# Patient Record
Sex: Female | Born: 1960 | Race: White | Hispanic: No | Marital: Single | Smoking: Current every day smoker
Health system: Southern US, Community
[De-identification: ages and names within clinical notes are randomized; demographics above are authoritative.]

## PROBLEM LIST (undated history)

## (undated) DIAGNOSIS — F32A Depression, unspecified: Secondary | ICD-10-CM

## (undated) DIAGNOSIS — F329 Major depressive disorder, single episode, unspecified: Secondary | ICD-10-CM

## (undated) DIAGNOSIS — F410 Panic disorder [episodic paroxysmal anxiety] without agoraphobia: Secondary | ICD-10-CM

## (undated) DIAGNOSIS — I10 Essential (primary) hypertension: Secondary | ICD-10-CM

## (undated) DIAGNOSIS — F319 Bipolar disorder, unspecified: Secondary | ICD-10-CM

## (undated) HISTORY — PX: COSMETIC SURGERY: SHX468

## (undated) HISTORY — PX: TONSILLECTOMY: SUR1361

## (undated) HISTORY — PX: LAPAROSCOPIC OOPHORECTOMY: SHX6693

---

## 2006-09-07 ENCOUNTER — Emergency Department: Payer: Self-pay | Admitting: Emergency Medicine

## 2008-11-16 ENCOUNTER — Emergency Department: Payer: Self-pay | Admitting: Emergency Medicine

## 2012-08-18 ENCOUNTER — Emergency Department: Payer: Self-pay | Admitting: Emergency Medicine

## 2013-10-07 ENCOUNTER — Ambulatory Visit: Payer: Self-pay | Admitting: Internal Medicine

## 2013-10-07 ENCOUNTER — Other Ambulatory Visit: Payer: Self-pay | Admitting: Internal Medicine

## 2013-10-07 LAB — SEDIMENTATION RATE: Erythrocyte Sed Rate: 1 mm/hr (ref 0–20)

## 2013-10-07 LAB — CBC WITH DIFFERENTIAL/PLATELET
Basophil #: 0.1 10*3/uL (ref 0.0–0.1)
Eosinophil #: 0.2 10*3/uL (ref 0.0–0.7)
Eosinophil %: 2.8 %
HGB: 15.8 g/dL (ref 12.0–16.0)
Lymphocyte #: 1.9 10*3/uL (ref 1.0–3.6)
Lymphocyte %: 21.4 %
MCHC: 33.8 g/dL (ref 32.0–36.0)
MCV: 94 fL (ref 80–100)
Monocyte #: 0.6 x10 3/mm (ref 0.2–0.9)
Neutrophil #: 6 10*3/uL (ref 1.4–6.5)
Neutrophil %: 67.9 %
Platelet: 202 10*3/uL (ref 150–440)
RBC: 5 10*6/uL (ref 3.80–5.20)

## 2013-10-07 LAB — COMPREHENSIVE METABOLIC PANEL
Bilirubin,Total: 0.7 mg/dL (ref 0.2–1.0)
Calcium, Total: 9.4 mg/dL (ref 8.5–10.1)
Chloride: 107 mmol/L (ref 98–107)
Creatinine: 0.74 mg/dL (ref 0.60–1.30)
EGFR (African American): >60
EGFR (Non-African Amer.): >60
Glucose: 94 mg/dL (ref 65–99)
Potassium: 4.7 mmol/L (ref 3.5–5.1)
SGPT (ALT): 28 U/L (ref 12–78)
Sodium: 135 mmol/L — ABNORMAL LOW (ref 136–145)
Total Protein: 7.8 g/dL (ref 6.4–8.2)

## 2013-10-07 LAB — FOLATE: Folic Acid: 13.9 ng/mL (ref 3.1–100.0)

## 2013-10-07 LAB — LIPID PANEL
Cholesterol: 170 mg/dL (ref 0–200)
HDL Cholesterol: 52 mg/dL (ref 40–60)
Ldl Cholesterol, Calc: 95 mg/dL (ref 0–100)
Triglycerides: 113 mg/dL (ref 0–200)

## 2013-12-02 ENCOUNTER — Emergency Department: Payer: Self-pay | Admitting: Emergency Medicine

## 2013-12-02 LAB — URINALYSIS, COMPLETE
Bacteria: NONE SEEN
Bilirubin,UR: NEGATIVE
Glucose,UR: NEGATIVE mg/dL (ref 0–75)
LEUKOCYTE ESTERASE: NEGATIVE
Nitrite: NEGATIVE
PH: 5 (ref 4.5–8.0)
Protein: NEGATIVE
RBC,UR: 1 /HPF (ref 0–5)
Specific Gravity: 1.006 (ref 1.003–1.030)
Squamous Epithelial: 1

## 2014-09-28 ENCOUNTER — Ambulatory Visit: Payer: Self-pay | Admitting: Physician Assistant

## 2014-09-28 LAB — CBC WITH DIFFERENTIAL/PLATELET
BASOS ABS: 0.1 10*3/uL (ref 0.0–0.1)
Basophil %: 1 %
Eosinophil #: 0.2 10*3/uL (ref 0.0–0.7)
Eosinophil %: 2.1 %
HCT: 44.3 % (ref 35.0–47.0)
HGB: 14.9 g/dL (ref 12.0–16.0)
LYMPHS PCT: 20.9 %
Lymphocyte #: 1.6 10*3/uL (ref 1.0–3.6)
MCH: 33.1 pg (ref 26.0–34.0)
MCHC: 33.5 g/dL (ref 32.0–36.0)
MCV: 99 fL (ref 80–100)
MONO ABS: 0.5 x10 3/mm (ref 0.2–0.9)
Monocyte %: 6.7 %
NEUTROS ABS: 5.2 10*3/uL (ref 1.4–6.5)
Neutrophil %: 69.3 %
Platelet: 253 10*3/uL (ref 150–440)
RBC: 4.49 10*6/uL (ref 3.80–5.20)
RDW: 12.8 % (ref 11.5–14.5)
WBC: 7.6 10*3/uL (ref 3.6–11.0)

## 2014-09-28 LAB — LIPID PANEL
CHOLESTEROL: 170 mg/dL (ref 0–200)
HDL: 54 mg/dL (ref 40–60)
Ldl Cholesterol, Calc: 85 mg/dL (ref 0–100)
TRIGLYCERIDES: 156 mg/dL (ref 0–200)
VLDL CHOLESTEROL, CALC: 31 mg/dL (ref 5–40)

## 2014-09-28 LAB — COMPREHENSIVE METABOLIC PANEL
ANION GAP: 8 (ref 7–16)
Albumin: 3.8 g/dL (ref 3.4–5.0)
Alkaline Phosphatase: 68 U/L
BUN: 9 mg/dL (ref 7–18)
Bilirubin,Total: 0.5 mg/dL (ref 0.2–1.0)
CO2: 25 mmol/L (ref 21–32)
CREATININE: 0.82 mg/dL (ref 0.60–1.30)
Calcium, Total: 8.8 mg/dL (ref 8.5–10.1)
Chloride: 107 mmol/L (ref 98–107)
Glucose: 105 mg/dL — ABNORMAL HIGH (ref 65–99)
OSMOLALITY: 278 (ref 275–301)
Potassium: 3.9 mmol/L (ref 3.5–5.1)
SGOT(AST): 32 U/L (ref 15–37)
SGPT (ALT): 41 U/L
SODIUM: 140 mmol/L (ref 136–145)
Total Protein: 7.8 g/dL (ref 6.4–8.2)

## 2014-09-28 LAB — TSH: Thyroid Stimulating Horm: 2.5 u[IU]/mL

## 2014-11-14 ENCOUNTER — Ambulatory Visit: Payer: Self-pay | Admitting: Internal Medicine

## 2014-11-14 LAB — HCG, QUANTITATIVE, PREGNANCY

## 2014-11-18 IMAGING — CR DG CHEST 2V
1 series · 2 of 2 positions shown · non-contrast
Comparison: None.

CLINICAL DATA: Chest pain, cough

EXAM:
CHEST  2 VIEW

[Series 1: w chest pa · 0.14mm/px · 2 of 2 slices shown]
[im 1/2]
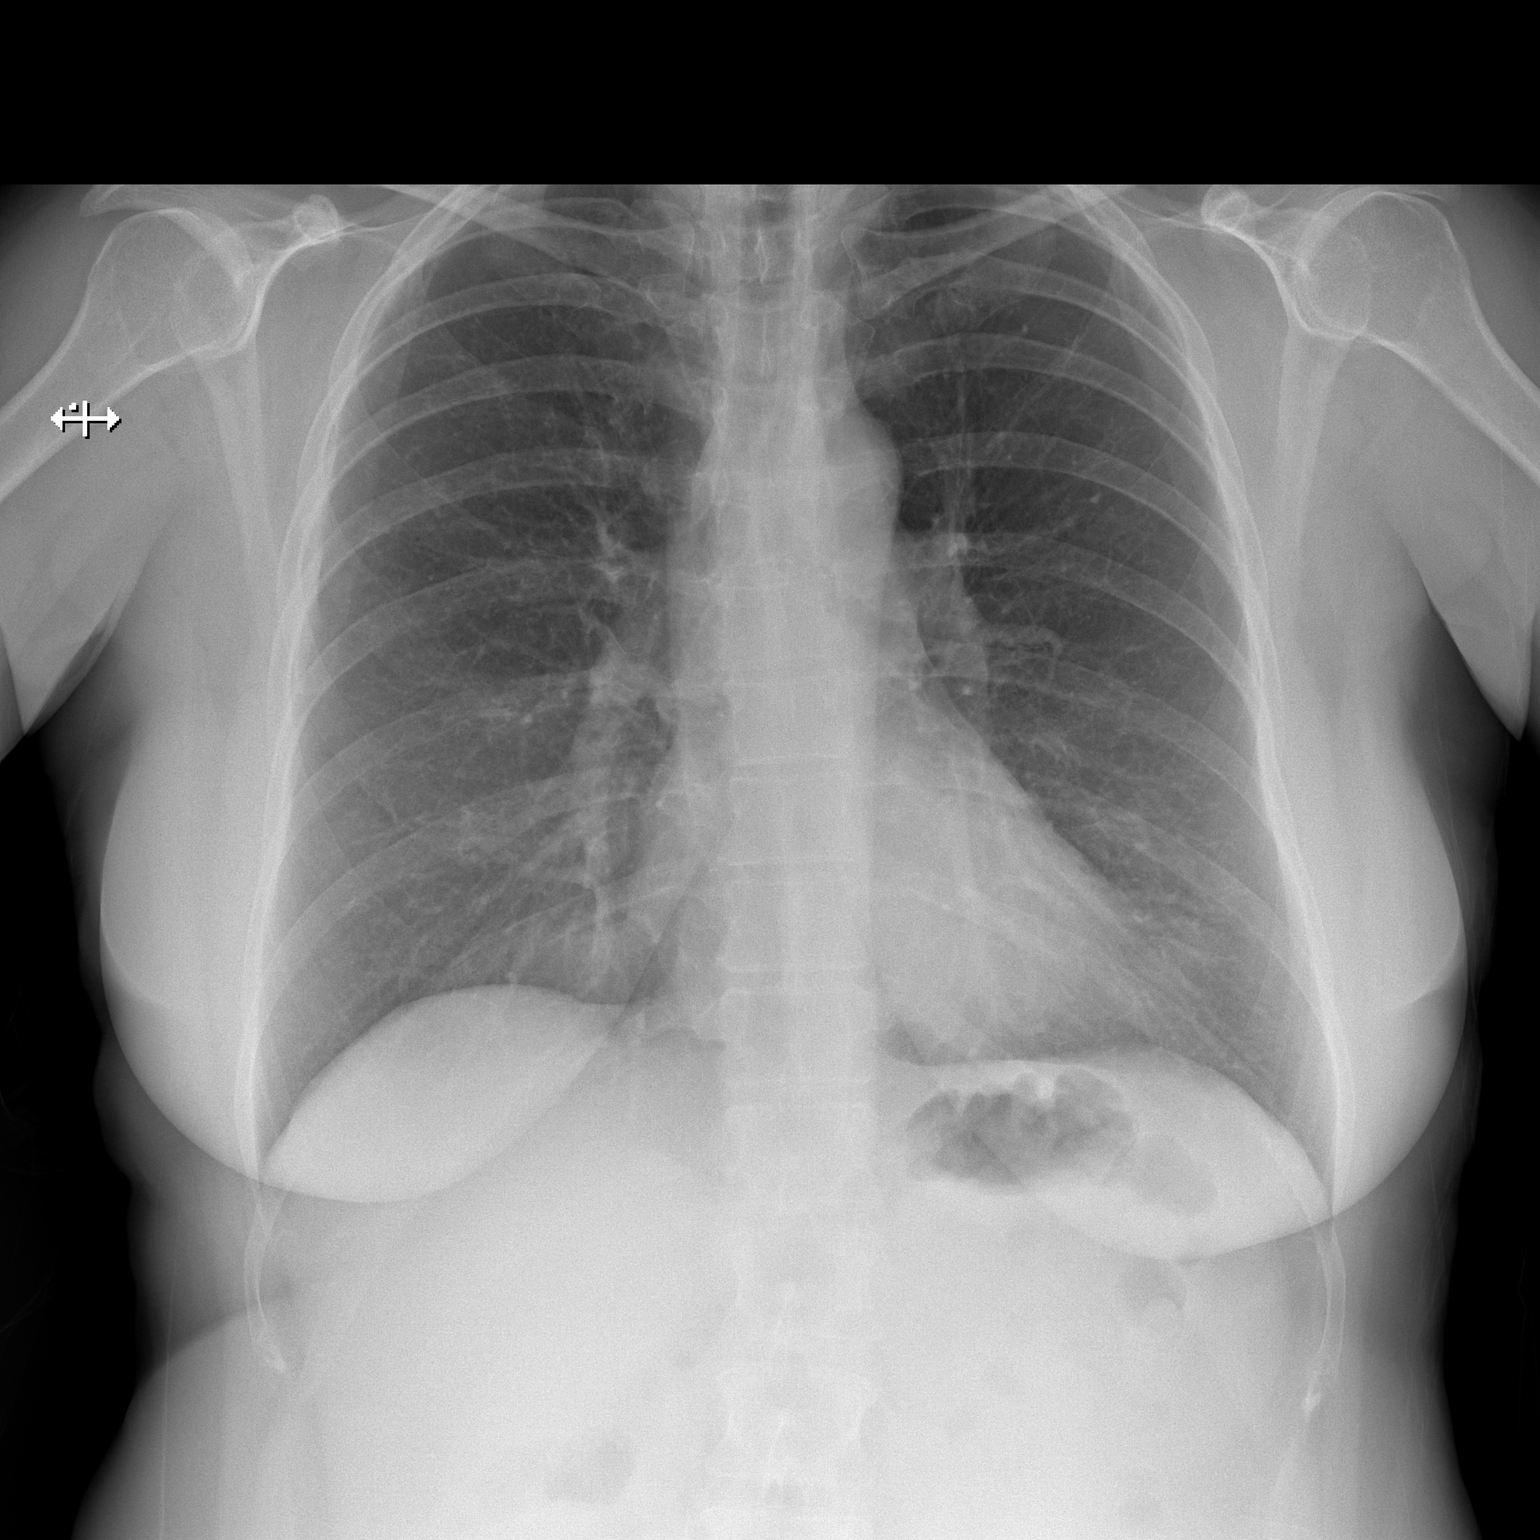
[im 2/2]
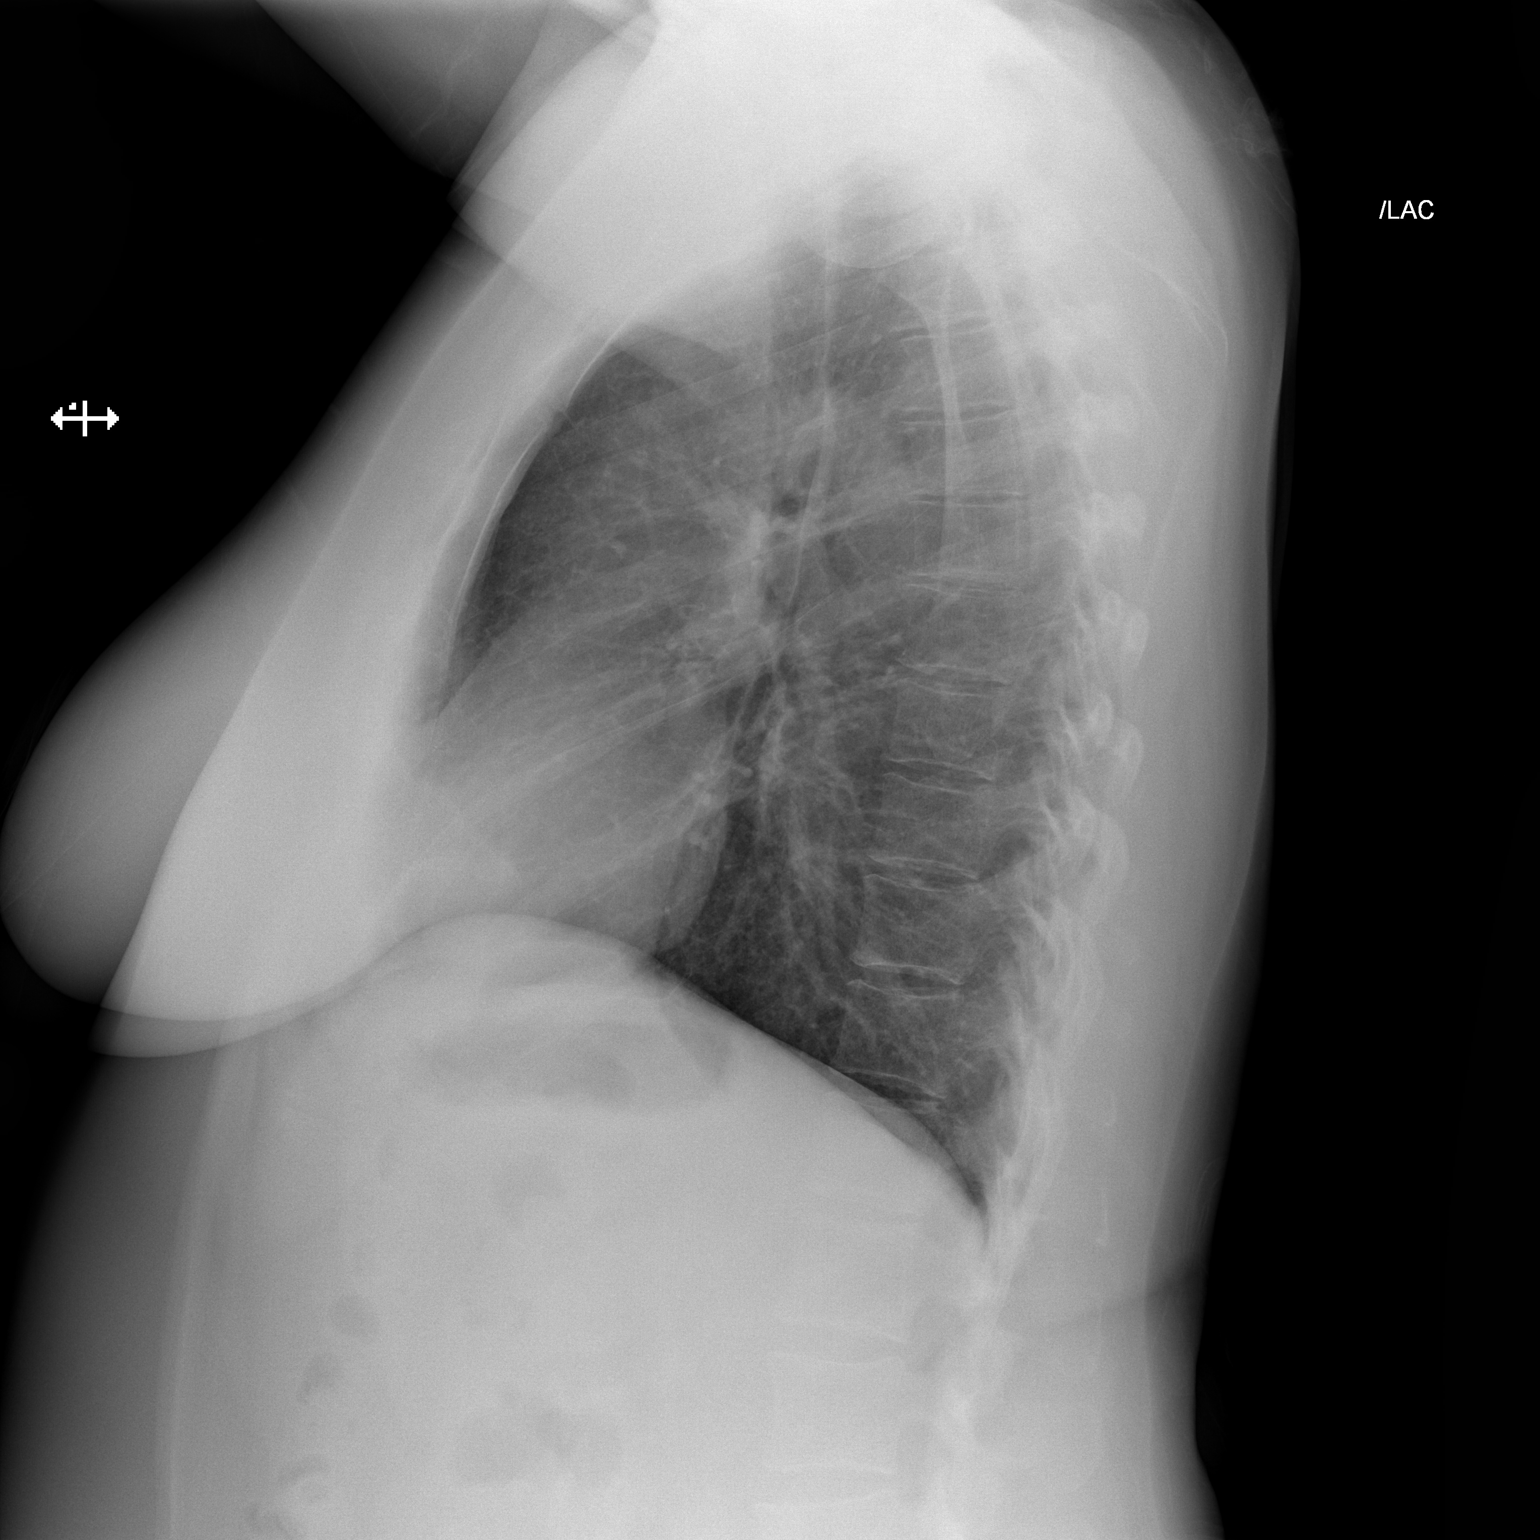

[2 of 2 positions shown; findings below may reference images not displayed]

FINDINGS: The heart size and mediastinal contours are within normal limits.
Both lungs are clear. The visualized skeletal structures are
unremarkable.
IMPRESSION: No active cardiopulmonary disease.

## 2015-04-26 ENCOUNTER — Other Ambulatory Visit: Payer: Self-pay | Admitting: Nurse Practitioner

## 2015-04-26 ENCOUNTER — Ambulatory Visit
Admission: RE | Admit: 2015-04-26 | Discharge: 2015-04-26 | Disposition: A | Discharge status: Home / Self Care | Payer: Medicaid Other | Source: Ambulatory Visit | Attending: Nurse Practitioner | Admitting: Nurse Practitioner

## 2015-04-26 DIAGNOSIS — R221 Localized swelling, mass and lump, neck: Secondary | ICD-10-CM | POA: Diagnosis not present

## 2015-04-26 DIAGNOSIS — M542 Cervicalgia: Secondary | ICD-10-CM

## 2015-09-14 ENCOUNTER — Encounter: Payer: Self-pay | Admitting: Emergency Medicine

## 2015-09-14 ENCOUNTER — Emergency Department
Admission: EM | Admit: 2015-09-14 | Discharge: 2015-09-14 | Disposition: A | Discharge status: Home / Self Care | Payer: Medicaid Other | Attending: Emergency Medicine | Admitting: Emergency Medicine

## 2015-09-14 DIAGNOSIS — Z720 Tobacco use: Secondary | ICD-10-CM | POA: Insufficient documentation

## 2015-09-14 DIAGNOSIS — I10 Essential (primary) hypertension: Secondary | ICD-10-CM | POA: Insufficient documentation

## 2015-09-14 DIAGNOSIS — Y9389 Activity, other specified: Secondary | ICD-10-CM | POA: Insufficient documentation

## 2015-09-14 DIAGNOSIS — S29012A Strain of muscle and tendon of back wall of thorax, initial encounter: Secondary | ICD-10-CM | POA: Diagnosis not present

## 2015-09-14 DIAGNOSIS — Y9289 Other specified places as the place of occurrence of the external cause: Secondary | ICD-10-CM | POA: Insufficient documentation

## 2015-09-14 DIAGNOSIS — X58XXXA Exposure to other specified factors, initial encounter: Secondary | ICD-10-CM | POA: Insufficient documentation

## 2015-09-14 DIAGNOSIS — Y998 Other external cause status: Secondary | ICD-10-CM | POA: Diagnosis not present

## 2015-09-14 DIAGNOSIS — S299XXA Unspecified injury of thorax, initial encounter: Secondary | ICD-10-CM | POA: Diagnosis present

## 2015-09-14 HISTORY — DX: Bipolar disorder, unspecified: F31.9

## 2015-09-14 HISTORY — DX: Essential (primary) hypertension: I10

## 2015-09-14 HISTORY — DX: Panic disorder (episodic paroxysmal anxiety): F41.0

## 2015-09-14 HISTORY — DX: Depression, unspecified: F32.A

## 2015-09-14 HISTORY — DX: Major depressive disorder, single episode, unspecified: F32.9

## 2015-09-14 MED ORDER — KETOROLAC TROMETHAMINE 10 MG PO TABS: 10.0000 mg | ORAL_TABLET | Freq: Four times a day (QID) | ORAL | Status: DC | PRN

## 2015-09-14 MED ORDER — DIAZEPAM 2 MG PO TABS
2.0000 mg | ORAL_TABLET | Freq: Once | ORAL | Status: AC
  Administered 2015-09-14: 2 mg via ORAL
  Filled 2015-09-14: qty 1

## 2015-09-14 MED ORDER — KETOROLAC TROMETHAMINE 60 MG/2ML IM SOLN
60.0000 mg | Freq: Once | INTRAMUSCULAR | Status: AC
  Administered 2015-09-14: 60 mg via INTRAMUSCULAR
  Filled 2015-09-14: qty 2

## 2015-09-14 MED ORDER — HYDROCODONE-ACETAMINOPHEN 5-325 MG PO TABS
1.0000 | ORAL_TABLET | Freq: Once | ORAL | Status: AC
  Administered 2015-09-14: 1 via ORAL
  Filled 2015-09-14: qty 1

## 2015-09-14 MED ORDER — HYDROCODONE-ACETAMINOPHEN 5-325 MG PO TABS: 1.0000 | ORAL_TABLET | ORAL | Status: DC | PRN

## 2015-09-14 MED ORDER — HYDROCODONE-ACETAMINOPHEN 5-325 MG PO TABS
ORAL_TABLET | ORAL | Status: AC
  Filled 2015-09-14: qty 1

## 2015-09-14 MED ORDER — DIAZEPAM 2 MG PO TABS: 2.0000 mg | ORAL_TABLET | Freq: Three times a day (TID) | ORAL | Status: DC | PRN

## 2015-09-14 NOTE — Discharge Instructions (Signed)
Muscle Strain °A muscle strain (pulled muscle) happens when a muscle is stretched beyond normal length. It happens when a sudden, violent force stretches your muscle too far. Usually, a few of the fibers in your muscle are torn. Muscle strain is common in athletes. Recovery usually takes 1-2 weeks. Complete healing takes 5-6 weeks.  °HOME CARE  °· Follow the PRICE method of treatment to help your injury get better. Do this the first 2-3 days after the injury: °· Protect. Protect the muscle to keep it from getting injured again. °· Rest. Limit your activity and rest the injured body part. °· Ice. Put ice in a plastic bag. Place a towel between your skin and the bag. Then, apply the ice and leave it on from 15-20 minutes each hour. After the third day, switch to moist heat packs. °· Compression. Use a splint or elastic bandage on the injured area for comfort. Do not put it on too tightly. °· Elevate. Keep the injured body part above the level of your heart. °· Only take medicine as told by your doctor. °· Warm up before doing exercise to prevent future muscle strains. °GET HELP IF:  °· You have more pain or puffiness (swelling) in the injured area. °· You feel numbness, tingling, or notice a loss of strength in the injured area. °MAKE SURE YOU:  °· Understand these instructions. °· Will watch your condition. °· Will get help right away if you are not doing well or get worse. °  °This information is not intended to replace advice given to you by your health care provider. Make sure you discuss any questions you have with your health care provider. °  °Document Released: 08/13/2008 Document Revised: 08/25/2013 Document Reviewed: 06/03/2013 °Elsevier Interactive Patient Education ©2016 [employer] ° °Cryotherapy °Cryotherapy is when you put ice on your injury. Ice helps lessen pain and puffiness (swelling) after an injury. Ice works the best when you start using it in the first 24 to 48 hours after an injury. °HOME  CARE °· Put a dry or damp towel between the ice pack and your skin. °· You may press gently on the ice pack. °· Leave the ice on for no more than 10 to 20 minutes at a time. °· Check your skin after 5 minutes to make sure your skin is okay. °· Rest at least 20 minutes between ice pack uses. °· Stop using ice when your skin loses feeling (numbness). °· Do not use ice on someone who cannot tell you when it hurts. This includes small children and people with memory problems (dementia). °GET HELP RIGHT AWAY IF: °· You have white spots on your skin. °· Your skin turns blue or pale. °· Your skin feels waxy or hard. °· Your puffiness gets worse. °MAKE SURE YOU:  °· Understand these instructions. °· Will watch your condition. °· Will get help right away if you are not doing well or get worse. °  °This information is not intended to replace advice given to you by your health care provider. Make sure you discuss any questions you have with your health care provider. °  °Document Released: 04/22/2008 Document Revised: 01/27/2012 Document Reviewed: 06/27/2011 °Elsevier Interactive Patient Education ©2016 [employer] ° °

## 2015-09-14 NOTE — ED Notes (Signed)
Pt to ed with c/o back pain that started after she lifted her dog off the bed x 3 days ago.

## 2015-09-14 NOTE — ED Notes (Signed)
Having lower back pain for the past 3 days.states she lifted her dog from bed and woke up with pain to lower back the next am. states pain is non radiating.  Pain increased with movement and inspriation

## 2015-09-14 NOTE — ED Provider Notes (Signed)
[facility] Emergency Department Provider Note  ____________________________________________  Time seen: Approximately 12:32 PM  I have reviewed the triage vital signs and the nursing notes.   HISTORY  Chief Complaint Back Pain   HPI Melody Moss is a 54 y.o. female 0 complaint of left sided back pain for 3 days. Patient states that she lifted her dog from the bed but did not recall an injury at that time. Later she woke with pain the next day which has not improved. Patient states the pain as sharp, nonradiating but increases with movement or inspiration. She is taken some over-the-counter medication at home without any relief. She denies any previous problems with her back. She denies any bowel or bladder incontinence. Patient complains that her muscles "spasm" which is what she cannot stand. Currently she rates her pain as an 8 out of 10.   Past Medical History  Diagnosis Date  . Hypertension   . Panic attack   . Depression   . Bipolar 1 disorder (HCC)     There are no active problems to display for this patient.   History reviewed. No pertinent past surgical history.  Current Outpatient Rx  Name  Route  Sig  Dispense  Refill  . diazepam (VALIUM) 2 MG tablet   Oral   Take 1 tablet (2 mg total) by mouth every 8 (eight) hours as needed for muscle spasms.   9 tablet   0   . HYDROcodone-acetaminophen (NORCO/VICODIN) 5-325 MG tablet   Oral   Take 1 tablet by mouth every 4 (four) hours as needed for moderate pain.   20 tablet   0   . ketorolac (TORADOL) 10 MG tablet   Oral   Take 1 tablet (10 mg total) by mouth every 6 (six) hours as needed for moderate pain.   12 tablet   0     Allergies Review of patient's allergies indicates no known allergies.  History reviewed. No pertinent family history.  Social History Social History  Substance Use Topics  . Smoking status: Current Every Day Smoker  . Smokeless tobacco: None  . Alcohol Use:  Yes    Review of Systems Constitutional: No fever/chills Cardiovascular: Denies chest pain. Respiratory: Denies shortness of breath. Gastrointestinal: No abdominal pain.  No nausea, no vomiting.   Genitourinary: Negative for dysuria. Musculoskeletal: Positive for back pain. Skin: Negative for rash. Neurological: Negative for headaches, focal weakness or numbness.  10-point ROS otherwise negative.  ____________________________________________   PHYSICAL EXAM:  VITAL SIGNS: ED Triage Vitals  Enc Vitals Group     BP 09/14/15 1121 137/88 mmHg     Pulse Rate 09/14/15 1121 63     Resp --      Temp 09/14/15 1121 98.4 F (36.9 C)     Temp Source 09/14/15 1121 Oral     SpO2 09/14/15 1121 97 %     Weight 09/14/15 1121 170 lb (77.111 kg)     Height 09/14/15 1121 5\' 3"  (1.6 m)     Head Cir --      Peak Flow --      Pain Score 09/14/15 1123 8     Pain Loc --      Pain Edu? --      Excl. in GC? --     Constitutional: Alert and oriented. Well appearing and in no acute distress. Eyes: Conjunctivae are normal. PERRL. EOMI. Head: Atraumatic. Nose: No congestion/rhinnorhea. Neck: No stridor.   Cardiovascular: Normal rate, regular rhythm.  Grossly normal heart sounds.  Good peripheral circulation. Respiratory: Normal respiratory effort.  No retractions. Lungs CTAB. Gastrointestinal: Soft and nontender. No distention. No abdominal bruits. No CVA tenderness. Musculoskeletal: Examination of the back feels show any gross abnormality. There is moderate tenderness on palpation of the left lateral thoracic muscle but nontender to spinous processes. Muscle is tight and spasms were seen with range of motion. No lower extremity tenderness nor edema.  No joint effusions. Neurologic:  Normal speech and language. No gross focal neurologic deficits are appreciated. No gait instability. Reflexes are 2+ bilaterally. Skin:  Skin is warm, dry and intact. No rash noted. Psychiatric: Mood and affect are  normal. Speech and behavior are normal.  ____________________________________________   LABS (all labs ordered are listed, but only abnormal results are displayed)  Labs Reviewed - No data to display  PROCEDURES  Procedure(s) performed: None  Critical Care performed: No  ____________________________________________   INITIAL IMPRESSION / ASSESSMENT AND PLAN / ED COURSE  Pertinent labs & imaging results that were available during my care of the patient were reviewed by me and considered in my medical decision making (see chart for details).  ----------------------------------------- 1:29 PM on 09/14/2015 ----------------------------------------- Patient improved with muscle spasms. Patient was given a prescription for Toradol 10 mg one every 6 hours #12 tablets, Norco as fever pain #20 and Valium 2 mg one every 8 hours for 3 days. Patient is encouraged to use ice or heat to her muscles as needed. She also has an appointment to follow-up with her primary care doctor. ____________________________________________   FINAL CLINICAL IMPRESSION(S) / ED DIAGNOSES  Final diagnoses:  Muscle strain of left upper back, initial encounter      Tommi Rumps, PA-C 09/14/15 1342  Arnaldo Natal, MD 09/14/15 [phone]

## 2016-06-06 IMAGING — CR DG CERVICAL SPINE COMPLETE 4+V
1 series · 5 of 5 positions shown · non-contrast
Comparison: None.

CLINICAL DATA: Pain.  No known injury.

EXAM:
CERVICAL SPINE  4+ VIEWS

[Series 1: dg cervical spine complete · 0.14mm/px · 5 of 5 slices shown]
[im 1/5]
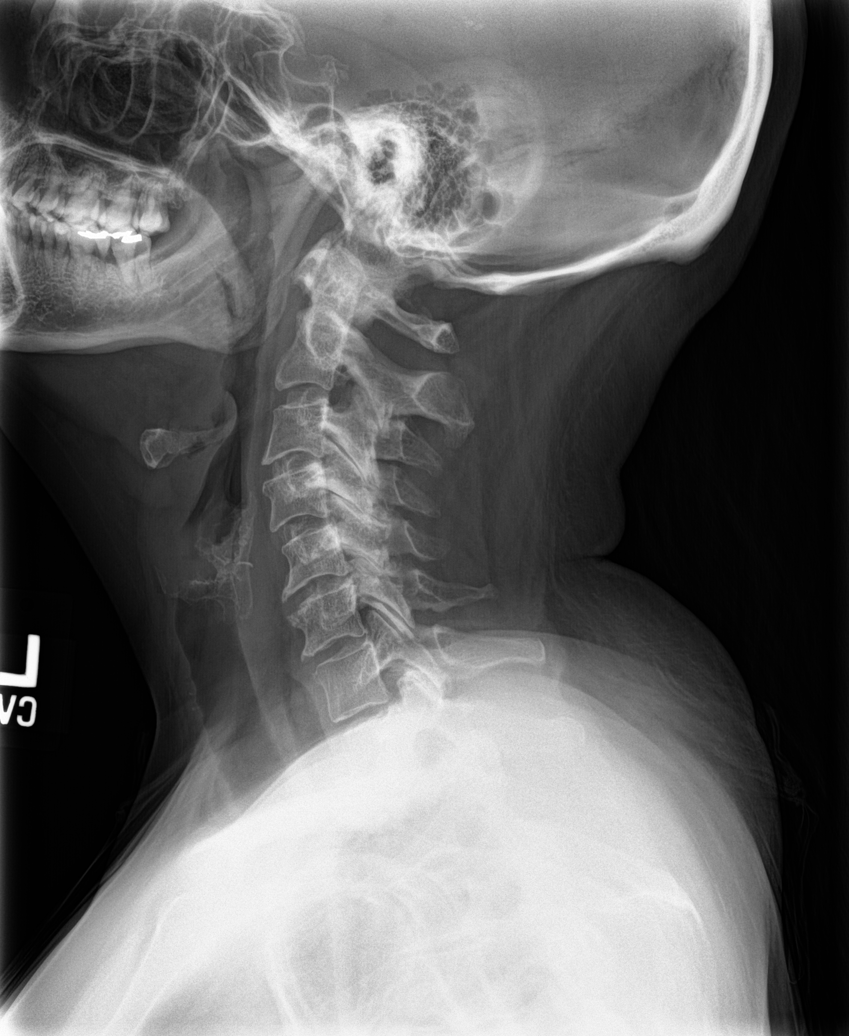
[im 2/5]
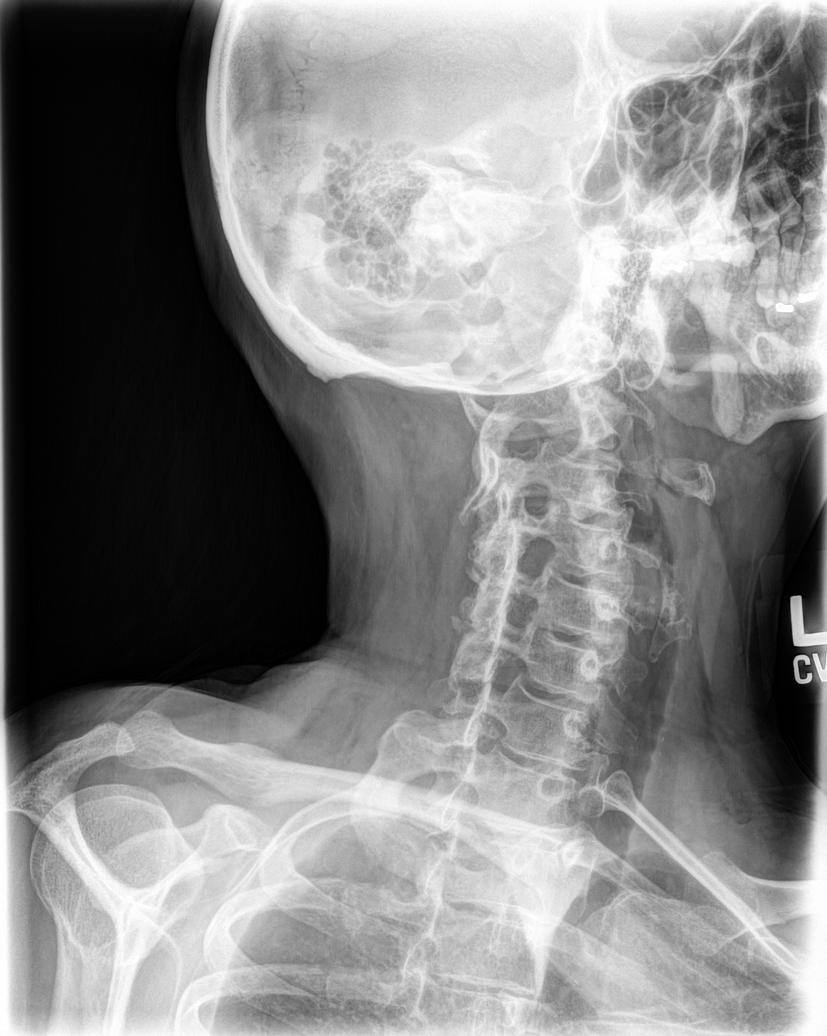
[im 3/5]
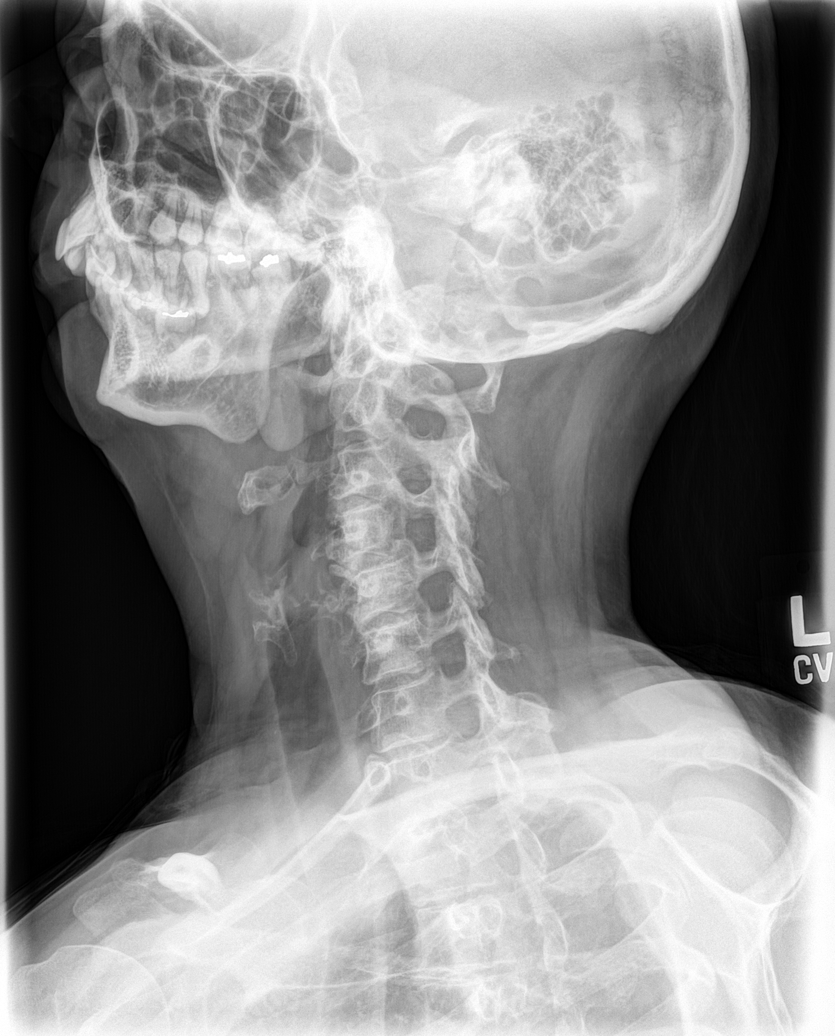
[im 4/5]
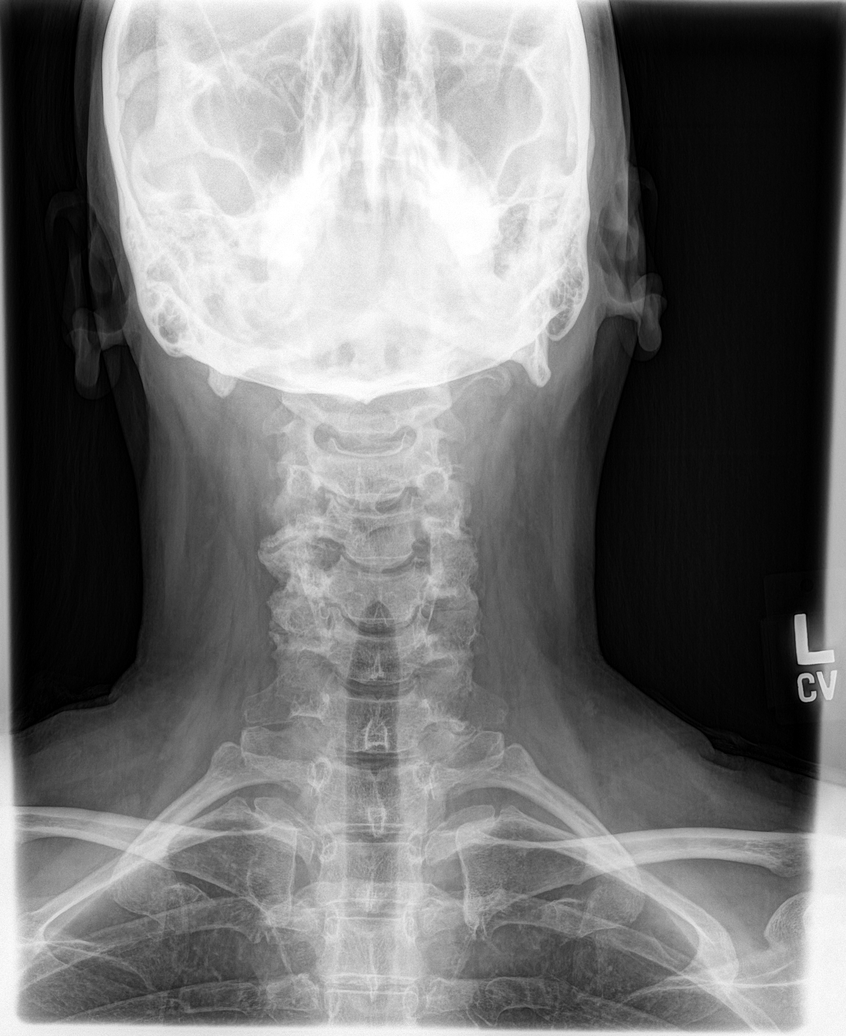
[im 5/5]
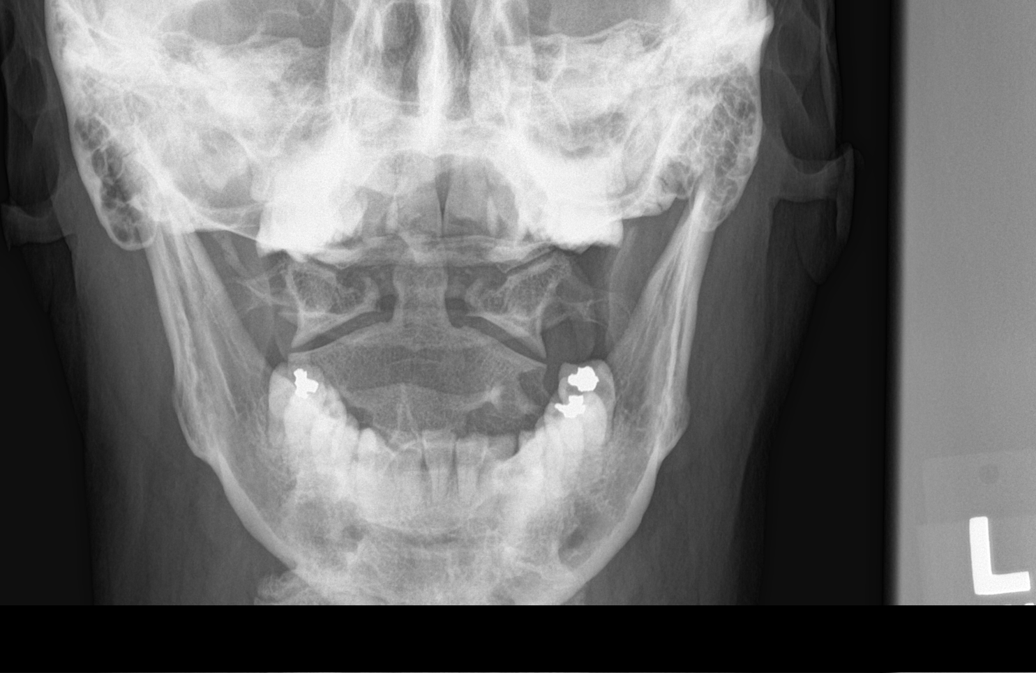

[5 of 5 positions shown; findings below may reference images not displayed]

FINDINGS: No acute bony abnormality . Diffuse multilevel degenerative change
noted. Prominent osteophytes at C5-C6. Pulmonary apices are clear.
IMPRESSION: 1. No acute bony abnormality.
2. Diffuse degenerative change.

## 2016-11-15 ENCOUNTER — Other Ambulatory Visit: Payer: Self-pay | Admitting: Nurse Practitioner

## 2016-11-15 DIAGNOSIS — Z1231 Encounter for screening mammogram for malignant neoplasm of breast: Secondary | ICD-10-CM

## 2016-12-18 ENCOUNTER — Ambulatory Visit
Admission: RE | Admit: 2016-12-18 | Discharge: 2016-12-18 | Disposition: A | Discharge status: Home / Self Care | Payer: Medicaid Other | Source: Ambulatory Visit | Attending: Nurse Practitioner | Admitting: Nurse Practitioner

## 2016-12-18 ENCOUNTER — Other Ambulatory Visit
Admission: RE | Admit: 2016-12-18 | Discharge: 2016-12-18 | Disposition: A | Discharge status: Home / Self Care | Payer: Medicaid Other | Source: Ambulatory Visit | Attending: Nurse Practitioner | Admitting: Nurse Practitioner

## 2016-12-18 DIAGNOSIS — Z1231 Encounter for screening mammogram for malignant neoplasm of breast: Secondary | ICD-10-CM | POA: Insufficient documentation

## 2016-12-18 LAB — LIPID PANEL
Cholesterol: 167 mg/dL (ref 0–200)
HDL: 44 mg/dL (ref >40)
LDL Cholesterol: 71 mg/dL (ref 0–99)
Total CHOL/HDL Ratio: 3.8 RATIO
Triglycerides: 260 mg/dL — ABNORMAL HIGH (ref <150)
VLDL: 52 mg/dL — ABNORMAL HIGH (ref 0–40)

## 2016-12-18 LAB — CBC
HCT: 41.9 % (ref 35.0–47.0)
Hemoglobin: 14.2 g/dL (ref 12.0–16.0)
MCH: 33.7 pg (ref 26.0–34.0)
MCHC: 33.8 g/dL (ref 32.0–36.0)
MCV: 99.6 fL (ref 80.0–100.0)
Platelets: 269 10*3/uL (ref 150–440)
RBC: 4.2 MIL/uL (ref 3.80–5.20)
RDW: 14 % (ref 11.5–14.5)
WBC: 5.3 10*3/uL (ref 3.6–11.0)

## 2016-12-18 LAB — COMPREHENSIVE METABOLIC PANEL
ALT: 23 U/L (ref 14–54)
AST: 23 U/L (ref 15–41)
Albumin: 4.3 g/dL (ref 3.5–5.0)
Alkaline Phosphatase: 56 U/L (ref 38–126)
Anion gap: 7 (ref 5–15)
BUN: 12 mg/dL (ref 6–20)
CO2: 27 mmol/L (ref 22–32)
CREATININE: 0.97 mg/dL (ref 0.44–1.00)
Calcium: 9.5 mg/dL (ref 8.9–10.3)
Chloride: 105 mmol/L (ref 101–111)
GFR calc Af Amer: >60 mL/min (ref >60)
GFR calc non Af Amer: >60 mL/min (ref >60)
GLUCOSE: 122 mg/dL — AB (ref 65–99)
Potassium: 4.7 mmol/L (ref 3.5–5.1)
SODIUM: 139 mmol/L (ref 135–145)
Total Bilirubin: 0.8 mg/dL (ref 0.3–1.2)
Total Protein: 7.3 g/dL (ref 6.5–8.1)

## 2016-12-18 LAB — TSH: TSH: 5.336 u[IU]/mL — ABNORMAL HIGH (ref 0.350–4.500)

## 2016-12-18 LAB — T4, FREE: Free T4: 0.79 ng/dL (ref 0.61–1.12)

## 2016-12-19 LAB — VITAMIN D 25 HYDROXY (VIT D DEFICIENCY, FRACTURES): Vit D, 25-Hydroxy: 18.7 ng/mL — ABNORMAL LOW (ref 30.0–100.0)

## 2016-12-20 ENCOUNTER — Other Ambulatory Visit: Payer: Self-pay | Admitting: *Deleted

## 2016-12-20 ENCOUNTER — Inpatient Hospital Stay
Admission: RE | Admit: 2016-12-20 | Discharge: 2016-12-20 | Disposition: A | Discharge status: Home / Self Care | Payer: Self-pay | Source: Ambulatory Visit | Attending: *Deleted | Admitting: *Deleted

## 2016-12-20 DIAGNOSIS — Z9289 Personal history of other medical treatment: Secondary | ICD-10-CM

## 2017-11-27 ENCOUNTER — Ambulatory Visit: Payer: Medicaid Other | Admitting: Nurse Practitioner

## 2017-11-27 ENCOUNTER — Encounter: Payer: Self-pay | Admitting: Nurse Practitioner

## 2017-11-27 VITALS — BP 130/80 | HR 70 | Resp 16 | Ht 63.0 in | Wt 165.2 lb

## 2017-11-27 DIAGNOSIS — R0602 Shortness of breath: Secondary | ICD-10-CM | POA: Diagnosis not present

## 2017-11-27 DIAGNOSIS — I10 Essential (primary) hypertension: Secondary | ICD-10-CM | POA: Diagnosis not present

## 2017-11-27 DIAGNOSIS — K219 Gastro-esophageal reflux disease without esophagitis: Secondary | ICD-10-CM

## 2017-11-27 DIAGNOSIS — Z0001 Encounter for general adult medical examination with abnormal findings: Secondary | ICD-10-CM

## 2017-11-27 DIAGNOSIS — G43009 Migraine without aura, not intractable, without status migrainosus: Secondary | ICD-10-CM | POA: Diagnosis not present

## 2017-11-27 DIAGNOSIS — F411 Generalized anxiety disorder: Secondary | ICD-10-CM

## 2017-11-27 DIAGNOSIS — E039 Hypothyroidism, unspecified: Secondary | ICD-10-CM | POA: Diagnosis not present

## 2017-11-27 DIAGNOSIS — M25541 Pain in joints of right hand: Secondary | ICD-10-CM | POA: Diagnosis not present

## 2017-11-27 DIAGNOSIS — J45909 Unspecified asthma, uncomplicated: Secondary | ICD-10-CM | POA: Insufficient documentation

## 2017-11-27 DIAGNOSIS — Z1231 Encounter for screening mammogram for malignant neoplasm of breast: Secondary | ICD-10-CM

## 2017-11-27 DIAGNOSIS — Z1239 Encounter for other screening for malignant neoplasm of breast: Secondary | ICD-10-CM

## 2017-11-27 MED ORDER — SPIRONOLACTONE 50 MG PO TABS: 50.0000 mg | ORAL_TABLET | Freq: Every day | ORAL | 3 refills | Status: DC

## 2017-11-27 MED ORDER — LEVOTHYROXINE SODIUM 25 MCG PO TABS: 25.0000 ug | ORAL_TABLET | Freq: Every day | ORAL | 3 refills | Status: DC

## 2017-11-27 MED ORDER — PROPRANOLOL HCL ER 160 MG PO CP24: 160.0000 mg | ORAL_CAPSULE | Freq: Every day | ORAL | 3 refills | Status: DC

## 2017-11-27 MED ORDER — PROAIR HFA 108 (90 BASE) MCG/ACT IN AERS: 2.0000 | INHALATION_SPRAY | Freq: Four times a day (QID) | RESPIRATORY_TRACT | 3 refills | Status: DC | PRN

## 2017-11-27 MED ORDER — OMEPRAZOLE 40 MG PO CPDR: 40.0000 mg | DELAYED_RELEASE_CAPSULE | Freq: Every day | ORAL | 3 refills | Status: DC

## 2017-11-27 NOTE — Progress Notes (Signed)
[facility] [address] [place], [place] [address]  Internal MEDICINE  Office Visit Note  Patient Name: Melody CalicoRina Moss  [id number]  [id number]  Date of Service: 11/27/2017     Complaints/HPI Pt is here for routine health maintenance examination  The patient is here for routine health maintenance exam. She states that she fell on her steps, the day before thanksgiving. She landed on right hand. The right hand is now very swollen and black and blue. ROM is limited in the little finger. Hand is weak and grips are difficult.  She does need to have refills of all routine medications.     Current Medication: Outpatient Encounter Medications as of 11/27/2017  Medication Sig  . budesonide-formoterol (SYMBICORT) 80-4.5 MCG/ACT inhaler Inhale 2 puffs into the lungs 2 (two) times daily.  Marland Kitchen. buPROPion (WELLBUTRIN XL) 150 MG 24 hr tablet TK 1 T PO QAM  . diazepam (VALIUM) 2 MG tablet Take 1 tablet (2 mg total) by mouth every 8 (eight) hours as needed for muscle spasms.  Marland Kitchen. dicyclomine (BENTYL) 20 MG tablet Take by mouth.  Marland Kitchen. HYDROcodone-acetaminophen (NORCO/VICODIN) 5-325 MG tablet Take 1 tablet by mouth every 4 (four) hours as needed for moderate pain.  Marland Kitchen. ketorolac (TORADOL) 10 MG tablet Take 1 tablet (10 mg total) by mouth every 6 (six) hours as needed for moderate pain.  Marland Kitchen. LATUDA 60 MG TABS TK 1 T PO WITH DINNER  . spironolactone (ALDACTONE) 50 MG tablet Take 1 tablet (50 mg total) by mouth daily.  . [DISCONTINUED] spironolactone (ALDACTONE) 50 MG tablet Take 50 mg by mouth daily.  Marland Kitchen. levothyroxine (SYNTHROID, LEVOTHROID) 25 MCG tablet Take 1 tablet (25 mcg total) by mouth daily before breakfast.  . omeprazole (PRILOSEC) 40 MG capsule Take 1 capsule (40 mg total) by mouth daily.  Marland Kitchen. PROAIR HFA 108 (90 Base) MCG/ACT inhaler Inhale 2 puffs into the lungs every 6 (six) hours as needed for wheezing or shortness of breath.  . propranolol ER (INDERAL LA) 160 MG SR capsule Take 1 capsule  (160 mg total) by mouth daily.  . [DISCONTINUED] levothyroxine (SYNTHROID, LEVOTHROID) 25 MCG tablet TK 1 T PO D OES  . [DISCONTINUED] omeprazole (PRILOSEC) 40 MG capsule TK 1 C PO D FOR GERD  . [DISCONTINUED] PROAIR HFA 108 (90 Base) MCG/ACT inhaler INHALE 2 PUFFS PO Q 4 TO 6 H PRF WHEEZING AND COUGH   No facility-administered encounter medications on file as of 11/27/2017.     Surgical History: No past surgical history on file.  Medical History: Past Medical History:  Diagnosis Date  . Bipolar 1 disorder (HCC)   . Depression   . Hypertension   . Panic attack     Family History: Family History  Adopted: Yes      Review of Systems  Constitutional: Negative for activity change, appetite change, chills, fatigue and unexpected weight change.  HENT: Negative for congestion, postnasal drip, rhinorrhea, sneezing and sore throat.   Eyes: Negative for redness.  Respiratory: Positive for wheezing. Negative for cough, chest tightness and shortness of breath.   Cardiovascular: Negative for chest pain and palpitations.  Gastrointestinal: Negative for abdominal pain, constipation, diarrhea, nausea and vomiting.  Endocrine: Negative.   Genitourinary: Negative for dysuria, flank pain, frequency and urgency.  Musculoskeletal: Positive for arthralgias. Negative for back pain, joint swelling and neck pain.       Pain, swelling and limited rm of right hand and right little finger after fall.   Skin: Negative.  Negative  for rash.  Neurological: Positive for headaches. Negative for tremors, weakness and numbness.  Hematological: Negative for adenopathy. Does not bruise/bleed easily.  Psychiatric/Behavioral: Positive for behavioral problems (Depression). Negative for sleep disturbance and suicidal ideas. The patient is nervous/anxious.        The patient sees psychiatry, regularly.     Today's Vitals   11/27/17 1543  BP: 130/80  Pulse: 70  Resp: 16  SpO2: 99%  Weight: 165 lb 3.2 oz (74.9  kg)  Height: 5\' 3"  (1.6 m)    Physical Exam  Constitutional: She is oriented to person, place, and time. She appears well-developed and well-nourished. No distress.  HENT:  Head: Normocephalic and atraumatic.  Mouth/Throat: Oropharynx is clear and moist. No oropharyngeal exudate.  Eyes: Conjunctivae and EOM are normal. Pupils are equal, round, and reactive to light.  Neck: Normal range of motion. Neck supple. No JVD present. Carotid bruit is not present. No tracheal deviation present. No thyromegaly present.  Cardiovascular: Normal rate, regular rhythm, normal heart sounds and intact distal pulses. Exam reveals no gallop and no friction rub.  No murmur heard. Pulmonary/Chest: Effort normal and breath sounds normal. No respiratory distress. She has no wheezes. She has no rales. She exhibits no tenderness.  Abdominal: Soft. Bowel sounds are normal. There is no tenderness.  Genitourinary: No breast swelling, tenderness, discharge or bleeding. Pelvic exam was performed with patient prone.  Genitourinary Comments: Pelvic exam deferred today  Musculoskeletal: Normal range of motion.       Hands: Lymphadenopathy:    She has no cervical adenopathy.  Neurological: She is alert and oriented to person, place, and time. No cranial nerve deficit.  Skin: Skin is warm and dry. She is not diaphoretic.  Psychiatric: She has a normal mood and affect. Her behavior is normal. Judgment and thought content normal.  Nursing note and vitals reviewed.   Assessment and Plan: 1. Encounter for health maintenance examination with abnormal findings Annual wellness visit today. Routine, fasting blood work ordered.  - Urinalysis, Routine w reflex microscopic  2. Pain in joint of right hand - DG Hand Complete Right; Future  3. Essential hypertension - spironolactone (ALDACTONE) 50 MG tablet; Take 1 tablet (50 mg total) by mouth daily.  Dispense: 30 tablet; Refill: 3  4. Hypothyroidism, unspecified type -  levothyroxine (SYNTHROID, LEVOTHROID) 25 MCG tablet; Take 1 tablet (25 mcg total) by mouth daily before breakfast.  Dispense: 30 tablet; Refill: 3  5. SOB (shortness of breath) - PROAIR HFA 108 (90 Base) MCG/ACT inhaler; Inhale 2 puffs into the lungs every 6 (six) hours as needed for wheezing or shortness of breath.  Dispense: 1 Inhaler; Refill: 3  6. Generalized anxiety disorder Continue regular visits with psychiatry as scheduled.   7. Allergic asthma, unspecified asthma severity, uncomplicated Continue symbicort twice daily. Use proair rescue inhaler as needed and as prescribed. Take allergy meds as prescribed.   8. Gastroesophageal reflux disease without esophagitis - omeprazole (PRILOSEC) 40 MG capsule; Take 1 capsule (40 mg total) by mouth daily.  Dispense: 30 capsule; Refill: 3  9. Migraine without aura and without status migrainosus, not intractable - propranolol ER (INDERAL LA) 160 MG SR capsule; Take 1 capsule (160 mg total) by mouth daily.  Dispense: 30 capsule; Refill: 3  10. Screening for breast cancer - MM Digital Screening; Future    General Counseling: I have discussed the findings of the evaluation and examination with Dwight.  I have also discussed any further diagnostic evaluation that may be  needed or ordered today. Lilah verbalizes understanding of the findings of todays visit. We also reviewed her medications today. she has been encouraged to call the office with any questions or concerns that should arise related to todays visit.  This patient was seen by Vincent Gros, FNP- C in Collaboration with Dr Lyndon Code as a part of collaborative care agreement   Time spent: 30 minutes    Lyndon Code, MD  Internal Medicine

## 2017-11-28 LAB — URINALYSIS, ROUTINE W REFLEX MICROSCOPIC
Bilirubin, UA: NEGATIVE
Glucose, UA: NEGATIVE
Ketones, UA: NEGATIVE
Leukocytes, UA: NEGATIVE
Nitrite, UA: NEGATIVE
Protein, UA: NEGATIVE
RBC, UA: NEGATIVE
Specific Gravity, UA: 1.029 (ref 1.005–1.030)
Urobilinogen, Ur: 0.2 mg/dL (ref 0.2–1.0)
pH, UA: 5.5 (ref 5.0–7.5)

## 2017-12-23 ENCOUNTER — Ambulatory Visit
Admission: RE | Admit: 2017-12-23 | Discharge: 2017-12-23 | Disposition: A | Discharge status: Home / Self Care | Payer: Medicaid Other | Source: Ambulatory Visit | Attending: Nurse Practitioner | Admitting: Nurse Practitioner

## 2017-12-23 DIAGNOSIS — Z1231 Encounter for screening mammogram for malignant neoplasm of breast: Secondary | ICD-10-CM | POA: Insufficient documentation

## 2017-12-23 DIAGNOSIS — Z1239 Encounter for other screening for malignant neoplasm of breast: Secondary | ICD-10-CM

## 2018-01-17 ENCOUNTER — Encounter: Payer: Self-pay | Admitting: Emergency Medicine

## 2018-01-17 ENCOUNTER — Other Ambulatory Visit: Payer: Self-pay

## 2018-01-17 DIAGNOSIS — F17210 Nicotine dependence, cigarettes, uncomplicated: Secondary | ICD-10-CM | POA: Insufficient documentation

## 2018-01-17 DIAGNOSIS — J45909 Unspecified asthma, uncomplicated: Secondary | ICD-10-CM | POA: Insufficient documentation

## 2018-01-17 DIAGNOSIS — E039 Hypothyroidism, unspecified: Secondary | ICD-10-CM | POA: Insufficient documentation

## 2018-01-17 DIAGNOSIS — R51 Headache: Secondary | ICD-10-CM | POA: Diagnosis present

## 2018-01-17 DIAGNOSIS — I10 Essential (primary) hypertension: Secondary | ICD-10-CM | POA: Diagnosis not present

## 2018-01-17 DIAGNOSIS — Z79899 Other long term (current) drug therapy: Secondary | ICD-10-CM | POA: Insufficient documentation

## 2018-01-17 NOTE — ED Triage Notes (Signed)
Pt arrives POV to triage with c/o HTN. Pt called EMS to her house and had BP of 214/136 at 2217. Pt is ambulatory to triage with NAD at this time.

## 2018-01-18 ENCOUNTER — Emergency Department
Admission: EM | Admit: 2018-01-18 | Discharge: 2018-01-18 | Disposition: A | Discharge status: Home / Self Care | Payer: Medicaid Other | Attending: Emergency Medicine | Admitting: Emergency Medicine

## 2018-01-18 DIAGNOSIS — R51 Headache: Secondary | ICD-10-CM

## 2018-01-18 DIAGNOSIS — R519 Headache, unspecified: Secondary | ICD-10-CM

## 2018-01-18 DIAGNOSIS — I10 Essential (primary) hypertension: Secondary | ICD-10-CM

## 2018-01-18 LAB — COMPREHENSIVE METABOLIC PANEL
ALBUMIN: 4.1 g/dL (ref 3.5–5.0)
ALT: 28 U/L (ref 14–54)
AST: 25 U/L (ref 15–41)
Alkaline Phosphatase: 65 U/L (ref 38–126)
Anion gap: 11 (ref 5–15)
BUN: 17 mg/dL (ref 6–20)
CHLORIDE: 105 mmol/L (ref 101–111)
CO2: 22 mmol/L (ref 22–32)
Calcium: 9.5 mg/dL (ref 8.9–10.3)
Creatinine, Ser: 0.76 mg/dL (ref 0.44–1.00)
GFR calc Af Amer: >60 mL/min (ref >60)
GLUCOSE: 132 mg/dL — AB (ref 65–99)
POTASSIUM: 3.7 mmol/L (ref 3.5–5.1)
SODIUM: 138 mmol/L (ref 135–145)
Total Bilirubin: 0.7 mg/dL (ref 0.3–1.2)
Total Protein: 7.2 g/dL (ref 6.5–8.1)

## 2018-01-18 LAB — CBC WITH DIFFERENTIAL/PLATELET
Basophils Absolute: 0.1 10*3/uL (ref 0–0.1)
Basophils Relative: 1 %
EOS PCT: 2 %
Eosinophils Absolute: 0.2 10*3/uL (ref 0–0.7)
HCT: 41.2 % (ref 35.0–47.0)
Hemoglobin: 14.5 g/dL (ref 12.0–16.0)
LYMPHS ABS: 2.5 10*3/uL (ref 1.0–3.6)
LYMPHS PCT: 25 %
MCH: 33.7 pg (ref 26.0–34.0)
MCHC: 35.2 g/dL (ref 32.0–36.0)
MCV: 95.8 fL (ref 80.0–100.0)
Monocytes Absolute: 0.6 10*3/uL (ref 0.2–0.9)
Monocytes Relative: 6 %
Neutro Abs: 6.6 10*3/uL — ABNORMAL HIGH (ref 1.4–6.5)
Neutrophils Relative %: 66 %
PLATELETS: 244 10*3/uL (ref 150–440)
RBC: 4.29 MIL/uL (ref 3.80–5.20)
RDW: 13.3 % (ref 11.5–14.5)
WBC: 10 10*3/uL (ref 3.6–11.0)

## 2018-01-18 LAB — T4, FREE: Free T4: 0.71 ng/dL (ref 0.61–1.12)

## 2018-01-18 LAB — TSH: TSH: 8.35 u[IU]/mL — ABNORMAL HIGH (ref 0.350–4.500)

## 2018-01-18 MED ORDER — BUTALBITAL-APAP-CAFFEINE 50-325-40 MG PO TABS
1.0000 | ORAL_TABLET | Freq: Once | ORAL | Status: AC
  Administered 2018-01-18: 1 via ORAL
  Filled 2018-01-18: qty 1

## 2018-01-18 MED ORDER — IBUPROFEN 600 MG PO TABS
600.0000 mg | ORAL_TABLET | Freq: Once | ORAL | Status: AC
  Administered 2018-01-18: 600 mg via ORAL
  Filled 2018-01-18: qty 1

## 2018-01-18 MED ORDER — BUTALBITAL-APAP-CAFFEINE 50-325-40 MG PO TABS: 1.0000 | ORAL_TABLET | Freq: Four times a day (QID) | ORAL | 0 refills | Status: DC | PRN

## 2018-01-18 NOTE — ED Notes (Signed)
Dr Lamont Snowballifenbark at bedside; pt says for several weeks she's been having burning headaches; tonight her blood pressure was very high at home so she called paramedics; they obtained a similar reading and pt decided it was best to come to the ED; pt awake and alert; talking in complete coherent sentences

## 2018-01-18 NOTE — ED Provider Notes (Signed)
[facility] Emergency Department Provider Note  ____________________________________________   First MD Initiated Contact with Patient 01/18/18 [phone]     (approximate)  I have reviewed the triage vital signs and the nursing notes.   HISTORY  Chief Complaint Hypertension    HPI Melody Moss is a 57 y.o. female who comes to the emergency department with several weeks of intermittent burning headaches radiating down her arms into her abdomen.  The symptoms are mild to moderate in severity.  They are intermittent.  Nothing particular seems to make them better or worse.  She is concerned because she checked her blood pressure at home and it was high so she called 911 and when EMS arrived they noted her blood pressure was over 200 so she came to the emergency department.  She is currently asymptomatic.  She does have a history of hypothyroid for which she takes levothyroxine although she has not had her blood work checked in over a year.   Past Medical History:  Diagnosis Date  . Bipolar 1 disorder (HCC)   . Depression   . Hypertension   . Panic attack     Patient Active Problem List   Diagnosis Date Noted  . Hypothyroidism 11/27/2017  . SOB (shortness of breath) 11/27/2017  . Generalized anxiety disorder 11/27/2017  . Allergic asthma, unspecified asthma severity, uncomplicated 11/27/2017  . Essential hypertension 11/27/2017    History reviewed. No pertinent surgical history.  Prior to Admission medications   Medication Sig Start Date End Date Taking? Authorizing Provider  budesonide-formoterol (SYMBICORT) 80-4.5 MCG/ACT inhaler Inhale 2 puffs into the lungs 2 (two) times daily.    [provider]  buPROPion (WELLBUTRIN XL) 150 MG 24 hr tablet TK 1 T PO QAM 10/28/17   [provider]  butalbital-acetaminophen-caffeine (FIORICET, ESGIC) 50-325-40 MG tablet Take 1-2 tablets by mouth every 6 (six) hours as needed for headache. 01/18/18 01/18/19   Merrily Brittleifenbark, Dorse Locy, MD  diazepam (VALIUM) 2 MG tablet Take 1 tablet (2 mg total) by mouth every 8 (eight) hours as needed for muscle spasms. 09/14/15   Tommi RumpsSummers, Rhonda L, PA-C  dicyclomine (BENTYL) 20 MG tablet Take by mouth.    [provider]  HYDROcodone-acetaminophen (NORCO/VICODIN) 5-325 MG tablet Take 1 tablet by mouth every 4 (four) hours as needed for moderate pain. 09/14/15   Tommi RumpsSummers, Rhonda L, PA-C  ketorolac (TORADOL) 10 MG tablet Take 1 tablet (10 mg total) by mouth every 6 (six) hours as needed for moderate pain. 09/14/15   Bridget HartshornSummers, Rhonda L, PA-C  LATUDA 60 MG TABS TK 1 T PO WITH DINNER 10/17/17   [provider]  levothyroxine (SYNTHROID, LEVOTHROID) 25 MCG tablet Take 1 tablet (25 mcg total) by mouth daily before breakfast. 11/27/17   Carlean JewsBoscia, Heather E, NP  omeprazole (PRILOSEC) 40 MG capsule Take 1 capsule (40 mg total) by mouth daily. 11/27/17   Carlean JewsBoscia, Heather E, NP  PROAIR HFA 108 (90 Base) MCG/ACT inhaler Inhale 2 puffs into the lungs every 6 (six) hours as needed for wheezing or shortness of breath. 11/27/17   Carlean JewsBoscia, Heather E, NP  propranolol ER (INDERAL LA) 160 MG SR capsule Take 1 capsule (160 mg total) by mouth daily. 11/27/17   Carlean JewsBoscia, Heather E, NP  spironolactone (ALDACTONE) 50 MG tablet Take 1 tablet (50 mg total) by mouth daily. 11/27/17   Carlean JewsBoscia, Heather E, NP    Allergies Patient has no known allergies.  Family History  Adopted: Yes    Social History  Social History   Tobacco Use  . Smoking status: Current Every Day Smoker    Packs/day: 0.50  . Smokeless tobacco: Never Used  Substance Use Topics  . Alcohol use: Yes  . Drug use: No    Review of Systems Constitutional: No fever/chills ENT: No sore throat. Cardiovascular: Denies chest pain. Respiratory: Denies shortness of breath. Gastrointestinal: No abdominal pain.  No nausea, no vomiting.  No diarrhea.  No constipation. Musculoskeletal: Negative for back pain. Neurological: Negative  for headaches   ____________________________________________   PHYSICAL EXAM:  VITAL SIGNS: ED Triage Vitals  Enc Vitals Group     BP 01/17/18 2308 (!) 211/108     Pulse Rate 01/17/18 2308 87     Resp 01/17/18 2308 20     Temp 01/17/18 2308 98.4 F (36.9 C)     Temp Source 01/17/18 2308 Oral     SpO2 01/17/18 2308 98 %     Weight 01/17/18 2305 165 lb (74.8 kg)     Height 01/17/18 2305 5\' 3"  (1.6 m)     Head Circumference --      Peak Flow --      Pain Score 01/17/18 2305 0     Pain Loc --      Pain Edu? --      Excl. in GC? --     Constitutional: Alert and oriented x4 pleasant cooperative speaks full clear sentences no diaphoresis Head: Atraumatic. Nose: No congestion/rhinnorhea. Mouth/Throat: No trismus Neck: No stridor.   Cardiovascular: Regular rate and rhythm Respiratory: Normal respiratory effort.  No retractions. Gastrointestinal: Soft nontender Neurologic:  Normal speech and language. No gross focal neurologic deficits are appreciated.  Skin:  Skin is warm, dry and intact. No rash noted.    ____________________________________________  LABS (all labs ordered are listed, but only abnormal results are displayed)  Labs Reviewed  COMPREHENSIVE METABOLIC PANEL - Abnormal; Notable for the following components:      Result Value   Glucose, Bld 132 (*)    All other components within normal limits  CBC WITH DIFFERENTIAL/PLATELET - Abnormal; Notable for the following components:   Neutro Abs 6.6 (*)    All other components within normal limits  TSH - Abnormal; Notable for the following components:   TSH 8.350 (*)    All other components within normal limits  T4, FREE    Lab work reviewed by me with no acute disease __________________________________________  EKG  ED ECG REPORT I, Merrily Brittle, the attending physician, personally viewed and interpreted this ECG.  Date: 01/19/2018 EKG Time:  Rate: 83 Rhythm: normal sinus rhythm QRS Axis:  normal Intervals: normal ST/T Wave abnormalities: normal Narrative Interpretation: no evidence of acute ischemia  ____________________________________________  RADIOLOGY   ____________________________________________   DIFFERENTIAL includes but not limited to  Hyperthyroidism, hypertensive emergency, intracerebral hemorrhage   PROCEDURES  Procedure(s) performed: no  Procedures  Critical Care performed: no  Observation: no ____________________________________________   INITIAL IMPRESSION / ASSESSMENT AND PLAN / ED COURSE  Pertinent labs & imaging results that were available during my care of the patient were reviewed by me and considered in my medical decision making (see chart for details).  Less concerning than the slightly elevated blood pressure the patient reports numbness tingling and intermittent headache for the past several days.  She has not had her thyroid checked in over a year.     ----------------------------------------- 3:48 AM on 01/18/2018 -----------------------------------------  The patient's blood work is reassuring.  Her headache is now  resolved and her blood pressure has normalized.  She will be discharged home in improved condition and verbalizes understanding and agreement with the plan. ____________________________________________   FINAL CLINICAL IMPRESSION(S) / ED DIAGNOSES  Final diagnoses:  Hypertension, unspecified type  Nonintractable headache, unspecified chronicity pattern, unspecified headache type      NEW MEDICATIONS STARTED DURING THIS VISIT:  Discharge Medication List as of 01/18/2018  3:48 AM    START taking these medications   Details  butalbital-acetaminophen-caffeine (FIORICET, ESGIC) 50-325-40 MG tablet Take 1-2 tablets by mouth every 6 (six) hours as needed for headache., Starting Sun 01/18/2018, Until Mon 01/18/2019, Print         Note:  This document was prepared using Dragon voice recognition software and  may include unintentional dictation errors.      Merrily Brittle, MD 01/19/18 [phone]

## 2018-01-18 NOTE — Discharge Instructions (Signed)
Fortunately today your blood work was reassuring.  Please take your headache medication as needed for severe symptoms and follow-up with your primary care physician this coming week for reevaluation.  Return to the emergency department sooner for any concerns whatsoever.  It was a pleasure to take care of you today, and thank you for coming to our emergency department.  If you have any questions or concerns before leaving please ask the nurse to grab me and I'm more than happy to go through your aftercare instructions again.  If you were prescribed any opioid pain medication today such as Norco, Vicodin, Percocet, morphine, hydrocodone, or oxycodone please make sure you do not drive when you are taking this medication as it can alter your ability to drive safely.  If you have any concerns once you are home that you are not improving or are in fact getting worse before you can make it to your follow-up appointment, please do not hesitate to call 911 and come back for further evaluation.  Merrily Brittle, MD  Results for orders placed or performed during the hospital encounter of 01/18/18  Comprehensive metabolic panel  Result Value Ref Range   Sodium 138 135 - 145 mmol/L   Potassium 3.7 3.5 - 5.1 mmol/L   Chloride 105 101 - 111 mmol/L   CO2 22 22 - 32 mmol/L   Glucose, Bld 132 (H) 65 - 99 mg/dL   BUN 17 6 - 20 mg/dL   Creatinine, Ser 1.61 0.44 - 1.00 mg/dL   Calcium 9.5 8.9 - 09.6 mg/dL   Total Protein 7.2 6.5 - 8.1 g/dL   Albumin 4.1 3.5 - 5.0 g/dL   AST 25 15 - 41 U/L   ALT 28 14 - 54 U/L   Alkaline Phosphatase 65 38 - 126 U/L   Total Bilirubin 0.7 0.3 - 1.2 mg/dL   GFR calc non Af Amer >60 >60 mL/min   GFR calc Af Amer >60 >60 mL/min   Anion gap 11 5 - 15  CBC with Differential  Result Value Ref Range   WBC 10.0 3.6 - 11.0 K/uL   RBC 4.29 3.80 - 5.20 MIL/uL   Hemoglobin 14.5 12.0 - 16.0 g/dL   HCT 04.5 40.9 - 81.1 %   MCV 95.8 80.0 - 100.0 fL   MCH 33.7 26.0 - 34.0 pg   MCHC  35.2 32.0 - 36.0 g/dL   RDW 91.4 78.2 - 95.6 %   Platelets 244 150 - 440 K/uL   Neutrophils Relative % 66 %   Neutro Abs 6.6 (H) 1.4 - 6.5 K/uL   Lymphocytes Relative 25 %   Lymphs Abs 2.5 1.0 - 3.6 K/uL   Monocytes Relative 6 %   Monocytes Absolute 0.6 0.2 - 0.9 K/uL   Eosinophils Relative 2 %   Eosinophils Absolute 0.2 0 - 0.7 K/uL   Basophils Relative 1 %   Basophils Absolute 0.1 0 - 0.1 K/uL  TSH  Result Value Ref Range   TSH 8.350 (H) 0.350 - 4.500 uIU/mL  T4, free  Result Value Ref Range   Free T4 0.71 0.61 - 1.12 ng/dL   Mm Digital Screening  Result Date: 12/23/2017 CLINICAL DATA:  Screening. EXAM: DIGITAL SCREENING BILATERAL MAMMOGRAM WITH CAD COMPARISON:  Previous exam(s). ACR Breast Density Category b: There are scattered areas of fibroglandular density. FINDINGS: There are no findings suspicious for malignancy. Images were processed with CAD. IMPRESSION: No mammographic evidence of malignancy. A result letter of this screening mammogram will  be mailed directly to the patient. RECOMMENDATION: Screening mammogram in one year. (Code:SM-B-01Y) BI-RADS CATEGORY  1: Negative. Electronically Signed   By: Edwin CapJennifer  Jarosz M.D.   On: 12/23/2017 12:46

## 2018-01-18 NOTE — ED Triage Notes (Addendum)
Patient sitting in lobby in no acute distress at this time.  

## 2018-01-18 NOTE — ED Notes (Signed)

## 2018-01-18 NOTE — ED Notes (Signed)
Blood drawn by ED tech Dorian and sent to lab

## 2018-01-20 ENCOUNTER — Encounter: Payer: Self-pay | Admitting: Internal Medicine

## 2018-01-20 ENCOUNTER — Ambulatory Visit: Payer: Medicaid Other | Admitting: Internal Medicine

## 2018-01-20 ENCOUNTER — Other Ambulatory Visit
Admission: RE | Admit: 2018-01-20 | Discharge: 2018-01-20 | Disposition: A | Discharge status: Home / Self Care | Payer: Medicaid Other | Source: Ambulatory Visit | Attending: Nurse Practitioner | Admitting: Nurse Practitioner

## 2018-01-20 VITALS — BP 160/100 | HR 77 | Resp 16 | Ht 63.0 in | Wt 167.6 lb

## 2018-01-20 DIAGNOSIS — R51 Headache: Secondary | ICD-10-CM | POA: Diagnosis not present

## 2018-01-20 DIAGNOSIS — I10 Essential (primary) hypertension: Secondary | ICD-10-CM

## 2018-01-20 DIAGNOSIS — E785 Hyperlipidemia, unspecified: Secondary | ICD-10-CM | POA: Diagnosis not present

## 2018-01-20 DIAGNOSIS — Z0001 Encounter for general adult medical examination with abnormal findings: Secondary | ICD-10-CM | POA: Insufficient documentation

## 2018-01-20 DIAGNOSIS — F411 Generalized anxiety disorder: Secondary | ICD-10-CM | POA: Diagnosis not present

## 2018-01-20 DIAGNOSIS — E039 Hypothyroidism, unspecified: Secondary | ICD-10-CM | POA: Diagnosis not present

## 2018-01-20 DIAGNOSIS — R519 Headache, unspecified: Secondary | ICD-10-CM

## 2018-01-20 LAB — TSH: TSH: 5.001 u[IU]/mL — AB (ref 0.350–4.500)

## 2018-01-20 LAB — LIPID PANEL
CHOL/HDL RATIO: 3.7 ratio
CHOLESTEROL: 206 mg/dL — AB (ref 0–200)
HDL: 55 mg/dL (ref >40)
LDL Cholesterol: 103 mg/dL — ABNORMAL HIGH (ref 0–99)
TRIGLYCERIDES: 238 mg/dL — AB (ref <150)
VLDL: 48 mg/dL — ABNORMAL HIGH (ref 0–40)

## 2018-01-20 LAB — CBC
HCT: 43.8 % (ref 35.0–47.0)
Hemoglobin: 14.8 g/dL (ref 12.0–16.0)
MCH: 32.7 pg (ref 26.0–34.0)
MCHC: 33.7 g/dL (ref 32.0–36.0)
MCV: 97.1 fL (ref 80.0–100.0)
PLATELETS: 242 10*3/uL (ref 150–440)
RBC: 4.52 MIL/uL (ref 3.80–5.20)
RDW: 13.6 % (ref 11.5–14.5)
WBC: 6.6 10*3/uL (ref 3.6–11.0)

## 2018-01-20 LAB — COMPREHENSIVE METABOLIC PANEL
ALK PHOS: 74 U/L (ref 38–126)
ALT: 33 U/L (ref 14–54)
AST: 30 U/L (ref 15–41)
Albumin: 4.5 g/dL (ref 3.5–5.0)
Anion gap: 9 (ref 5–15)
BUN: 21 mg/dL — AB (ref 6–20)
CALCIUM: 10.2 mg/dL (ref 8.9–10.3)
CHLORIDE: 104 mmol/L (ref 101–111)
CO2: 24 mmol/L (ref 22–32)
CREATININE: 0.73 mg/dL (ref 0.44–1.00)
Glucose, Bld: 115 mg/dL — ABNORMAL HIGH (ref 65–99)
Potassium: 4.4 mmol/L (ref 3.5–5.1)
Sodium: 137 mmol/L (ref 135–145)
TOTAL PROTEIN: 7.5 g/dL (ref 6.5–8.1)
Total Bilirubin: 0.7 mg/dL (ref 0.3–1.2)

## 2018-01-20 LAB — T4, FREE: FREE T4: 0.71 ng/dL (ref 0.61–1.12)

## 2018-01-20 MED ORDER — CLONIDINE HCL 0.1 MG PO TABS: ORAL_TABLET | ORAL | 3 refills | Status: DC

## 2018-01-20 MED ORDER — CARVEDILOL 6.25 MG PO TABS: 6.2500 mg | ORAL_TABLET | Freq: Two times a day (BID) | ORAL | 3 refills | Status: DC

## 2018-01-20 MED ORDER — AMLODIPINE BESYLATE 5 MG PO TABS: 5.0000 mg | ORAL_TABLET | Freq: Every day | ORAL | 6 refills | Status: DC

## 2018-01-20 MED ORDER — LEVOTHYROXINE SODIUM 50 MCG PO TABS
50.0000 ug | ORAL_TABLET | Freq: Every day | ORAL | 3 refills | Status: DC
Start: 2018-01-20 — End: 2019-01-11

## 2018-01-20 NOTE — Progress Notes (Signed)
[facility] [address] [place], [place] [address]  Internal MEDICINE  Office Visit Note  Patient Name: Melody Moss  [id number]  [id number]  Date of Service: 01/20/2018  Chief Complaint  Patient presents with  . Hypertension    hospital follow up  . Headache    Hypertension  This is a recurrent problem. The current episode started in the past 7 days. The problem has been gradually worsening since onset. The problem is uncontrolled. Associated symptoms include headaches. Pertinent negatives include no chest pain, neck pain, palpitations or shortness of breath. There are no associated agents to hypertension. Risk factors for coronary artery disease include obesity. Past treatments include beta blockers. The current treatment provides mild improvement. There are no compliance problems.   Headache   This is a new problem. The current episode started in the past 7 days. The problem occurs daily. Progression since onset: pt went to ED for acclerated htn. Pertinent negatives include no abdominal pain, back pain, coughing, eye redness, nausea, neck pain, numbness, rhinorrhea, sore throat or vomiting. Her past medical history is significant for hypertension.    Current Medication: Outpatient Encounter Medications as of 01/20/2018  Medication Sig  . busPIRone (BUSPAR) 15 MG tablet take 1 tablet by mouth three times a day (DOSAGE INCREASE)  . ziprasidone (GEODON) 20 MG capsule Take by mouth.  Marland Kitchen amLODipine (NORVASC) 5 MG tablet Take 1 tablet (5 mg total) by mouth daily.  . budesonide-formoterol (SYMBICORT) 80-4.5 MCG/ACT inhaler Inhale 2 puffs into the lungs 2 (two) times daily.  Marland Kitchen buPROPion (WELLBUTRIN XL) 150 MG 24 hr tablet TK 1 T PO QAM  . butalbital-acetaminophen-caffeine (FIORICET, ESGIC) 50-325-40 MG tablet Take 1-2 tablets by mouth every 6 (six) hours as needed for headache.  . carbamazepine (CARBATROL) 300 MG 12 hr capsule TK 2  PO QHS  . carvedilol (COREG) 6.25 MG tablet  Take 1 tablet (6.25 mg total) by mouth 2 (two) times daily with a meal.  . cloNIDine (CATAPRES) 0.1 MG tablet Take one tab po bid prn for elevated bp more than 160  . diazepam (VALIUM) 2 MG tablet Take 1 tablet (2 mg total) by mouth every 8 (eight) hours as needed for muscle spasms.  Marland Kitchen dicyclomine (BENTYL) 20 MG tablet Take by mouth.  . doxycycline (VIBRA-TABS) 100 MG tablet TK 1 T PO BID FOR 10 DAYS THEN TK 1 T PO QD  . HYDROcodone-acetaminophen (NORCO/VICODIN) 5-325 MG tablet Take 1 tablet by mouth every 4 (four) hours as needed for moderate pain.  Marland Kitchen ketorolac (TORADOL) 10 MG tablet Take 1 tablet (10 mg total) by mouth every 6 (six) hours as needed for moderate pain.  Marland Kitchen LATUDA 60 MG TABS TK 1 T PO WITH DINNER  . levothyroxine (SYNTHROID, LEVOTHROID) 50 MCG tablet Take 1 tablet (50 mcg total) by mouth daily. On empty stomach  . NUEDEXTA 20-10 MG CAPS TK ONE C PO BID FOR PSEUDOBULBAR EFFECT  . omeprazole (PRILOSEC) 40 MG capsule Take 1 capsule (40 mg total) by mouth daily.  Marland Kitchen PROAIR HFA 108 (90 Base) MCG/ACT inhaler Inhale 2 puffs into the lungs every 6 (six) hours as needed for wheezing or shortness of breath.  . propranolol ER (INDERAL LA) 160 MG SR capsule Take 1 capsule (160 mg total) by mouth daily.  Marland Kitchen spironolactone (ALDACTONE) 50 MG tablet Take 1 tablet (50 mg total) by mouth daily.  . [DISCONTINUED] hydrochlorothiazide (MICROZIDE) 12.5 MG capsule Take by mouth.  . [DISCONTINUED] levothyroxine (SYNTHROID, LEVOTHROID) 25 MCG  tablet Take 1 tablet (25 mcg total) by mouth daily before breakfast.   No facility-administered encounter medications on file as of 01/20/2018.     Surgical History: History reviewed. No pertinent surgical history.  Medical History: Past Medical History:  Diagnosis Date  . Bipolar 1 disorder (HCC)   . Depression   . Hypertension   . Panic attack     Family History: Family History  Adopted: Yes    Social History   Socioeconomic History  . Marital  status: Single    Spouse name: Not on file  . Number of children: Not on file  . Years of education: Not on file  . Highest education level: Not on file  Social Needs  . Financial resource strain: Not on file  . Food insecurity - worry: Not on file  . Food insecurity - inability: Not on file  . Transportation needs - medical: Not on file  . Transportation needs - non-medical: Not on file  Occupational History  . Not on file  Tobacco Use  . Smoking status: Current Every Day Smoker    Packs/day: 0.50  . Smokeless tobacco: Never Used  Substance and Sexual Activity  . Alcohol use: Yes  . Drug use: No  . Sexual activity: Not on file  Other Topics Concern  . Not on file  Social History Narrative  . Not on file    Review of Systems  Constitutional: Negative for chills, fatigue and unexpected weight change.  HENT: Positive for postnasal drip. Negative for congestion, rhinorrhea, sneezing and sore throat.   Eyes: Negative for redness.  Respiratory: Negative for cough, chest tightness and shortness of breath.   Cardiovascular: Negative for chest pain and palpitations.  Gastrointestinal: Negative for abdominal pain, constipation, diarrhea, nausea and vomiting.  Genitourinary: Negative for dysuria and frequency.  Musculoskeletal: Negative for arthralgias, back pain, joint swelling and neck pain.  Skin: Negative for rash.  Neurological: Positive for headaches. Negative for tremors and numbness.  Hematological: Negative for adenopathy. Does not bruise/bleed easily.  Psychiatric/Behavioral: Positive for behavioral problems (Depression). Negative for sleep disturbance and suicidal ideas. The patient is nervous/anxious.     Vital Signs: BP (!) 160/100 (BP Location: Left Arm, Patient Position: Sitting)   Pulse 77   Resp 16   Ht 5\' 3"  (1.6 m)   Wt 167 lb 9.6 oz (76 kg)   SpO2 98%   BMI 29.69 kg/m    Physical Exam  Constitutional: She is oriented to person, place, and time. She  appears well-developed and well-nourished. No distress.  HENT:  Head: Normocephalic and atraumatic.  Mouth/Throat: Oropharynx is clear and moist. No oropharyngeal exudate.  Eyes: EOM are normal. Pupils are equal, round, and reactive to light.  Neck: Normal range of motion. Neck supple.  Cardiovascular: Normal rate, regular rhythm and normal heart sounds. Exam reveals no gallop.  No murmur heard. Pulmonary/Chest: Effort normal.  Abdominal: Soft. Bowel sounds are normal.  Musculoskeletal: Normal range of motion.  Neurological: She is alert and oriented to person, place, and time. No cranial nerve deficit.  Skin: Skin is warm and dry. She is not diaphoretic.  Psychiatric: She has a normal mood and affect. Her behavior is normal. Judgment and thought content normal.    General Counseling: Thara verbalizes understanding of the findings of todays visit and agrees with plan of treatment. I have discussed any further diagnostic evaluation that may be needed or ordered today. We also reviewed her medications today. she has been encouraged to  call the office with any questions or concerns that should arise related to todays visit. ASSESSMENT/PLAN  1. Nonintractable episodic headache, unspecified headache type- - Headache free at this time, she was given fiorcett at ED, will monitor for now, cpntrol bp   2. Essential hypertension, malignant - DC Inderal for now - Start carvedilol (COREG) 6.25 MG tablet; Take 1 tablet (6.25 mg total) by mouth 2 (two) times daily with a meal.  Dispense: 60 tablet; Refill: 3 -  Start amLODipine (NORVASC) 5 MG tablet; Take 1 tablet (5 mg total) by mouth daily.  Dispense: 30 tablet; Refill: 6 - Start  cloNIDine (CATAPRES) 0.1 MG tablet; Take one tab po bid prn for elevated bp more than 160  Dispense: 60 tablet; Refill: 3  3. Hypothyroidism, unspecified type - Increase  levothyroxine (SYNTHROID, LEVOTHROID) 50 MCG tablet; Take 1 tablet (50 mcg total) by mouth daily. On  empty stomach  Dispense: 90 tablet; Refill: 3  4. Generalized anxiety disorder - Per psych, bring all meds    Meds ordered this encounter  Medications  . carvedilol (COREG) 6.25 MG tablet    Sig: Take 1 tablet (6.25 mg total) by mouth 2 (two) times daily with a meal.    Dispense:  60 tablet    Refill:  3  . amLODipine (NORVASC) 5 MG tablet    Sig: Take 1 tablet (5 mg total) by mouth daily.    Dispense:  30 tablet    Refill:  6  . cloNIDine (CATAPRES) 0.1 MG tablet    Sig: Take one tab po bid prn for elevated bp more than 160    Dispense:  60 tablet    Refill:  3  . levothyroxine (SYNTHROID, LEVOTHROID) 50 MCG tablet    Sig: Take 1 tablet (50 mcg total) by mouth daily. On empty stomach    Dispense:  90 tablet    Refill:  3    Time spent:30 Minutes  Dr Lyndon CodeFozia M Wade [facility]ala Internal medicine

## 2018-01-21 LAB — VITAMIN D 25 HYDROXY (VIT D DEFICIENCY, FRACTURES): Vit D, 25-Hydroxy: 13.4 ng/mL — ABNORMAL LOW (ref 30.0–100.0)

## 2018-02-17 ENCOUNTER — Encounter: Payer: Self-pay | Admitting: Nurse Practitioner

## 2018-02-17 ENCOUNTER — Ambulatory Visit: Payer: Medicaid Other | Admitting: Nurse Practitioner

## 2018-02-17 VITALS — BP 138/88 | HR 93 | Resp 16 | Ht 63.0 in | Wt 168.6 lb

## 2018-02-17 DIAGNOSIS — G43009 Migraine without aura, not intractable, without status migrainosus: Secondary | ICD-10-CM

## 2018-02-17 DIAGNOSIS — E559 Vitamin D deficiency, unspecified: Secondary | ICD-10-CM | POA: Diagnosis not present

## 2018-02-17 DIAGNOSIS — E039 Hypothyroidism, unspecified: Secondary | ICD-10-CM | POA: Diagnosis not present

## 2018-02-17 DIAGNOSIS — I10 Essential (primary) hypertension: Secondary | ICD-10-CM | POA: Diagnosis not present

## 2018-02-17 DIAGNOSIS — R5383 Other fatigue: Secondary | ICD-10-CM | POA: Diagnosis not present

## 2018-02-17 MED ORDER — ERGOCALCIFEROL 1.25 MG (50000 UT) PO CAPS: 50000.0000 [IU] | ORAL_CAPSULE | ORAL | 5 refills | Status: DC

## 2018-02-17 NOTE — Progress Notes (Signed)
[facility] [address] [place], [place] [address]  Internal MEDICINE  Office Visit Note  Patient Name: Melody Moss  [id number]  [id number]  Date of Service: 03/13/2018   Pt is here for routine follow up.  Chief Complaint  Patient presents with  . Follow-up    At patient's last visit, blood pressure medication was adjusted due to increased blood pressure and present of severe headache. After changes were made, her blood pressure has improved and headaches have improved. She did have labs done and we will review results.        Current Medication: Outpatient Encounter Medications as of 02/17/2018  Medication Sig Note  . amLODipine (NORVASC) 5 MG tablet Take 1 tablet (5 mg total) by mouth daily.   . budesonide-formoterol (SYMBICORT) 80-4.5 MCG/ACT inhaler Inhale 2 puffs into the lungs 2 (two) times daily.   Marland Kitchen buPROPion (WELLBUTRIN XL) 150 MG 24 hr tablet TK 1 T PO QAM   . busPIRone (BUSPAR) 15 MG tablet take 1 tablet by mouth three times a day (DOSAGE INCREASE)   . butalbital-acetaminophen-caffeine (FIORICET, ESGIC) 50-325-40 MG tablet Take 1-2 tablets by mouth every 6 (six) hours as needed for headache.   . carbamazepine (CARBATROL) 300 MG 12 hr capsule TK 2 [place] PO QHS   . carvedilol (COREG) 6.25 MG tablet Take 1 tablet (6.25 mg total) by mouth 2 (two) times daily with a meal.   . cloNIDine (CATAPRES) 0.1 MG tablet Take one tab po bid prn for elevated bp more than 160   . diazepam (VALIUM) 2 MG tablet Take 1 tablet (2 mg total) by mouth every 8 (eight) hours as needed for muscle spasms.   Marland Kitchen dicyclomine (BENTYL) 20 MG tablet Take by mouth.   . doxycycline (VIBRA-TABS) 100 MG tablet TK 1 T PO BID FOR 10 DAYS THEN TK 1 T PO QD   . HYDROcodone-acetaminophen (NORCO/VICODIN) 5-325 MG tablet Take 1 tablet by mouth every 4 (four) hours as needed for moderate pain.   Marland Kitchen ketorolac (TORADOL) 10 MG tablet Take 1 tablet (10 mg total) by mouth every 6 (six) hours as needed for  moderate pain.   Marland Kitchen LATUDA 60 MG TABS TK 1 T PO WITH DINNER   . levothyroxine (SYNTHROID, LEVOTHROID) 50 MCG tablet Take 1 tablet (50 mcg total) by mouth daily. On empty stomach   . NUEDEXTA 20-10 MG CAPS TK ONE C PO BID FOR PSEUDOBULBAR EFFECT   . omeprazole (PRILOSEC) 40 MG capsule Take 1 capsule (40 mg total) by mouth daily.   Marland Kitchen PROAIR HFA 108 (90 Base) MCG/ACT inhaler Inhale 2 puffs into the lungs every 6 (six) hours as needed for wheezing or shortness of breath.   . spironolactone (ALDACTONE) 50 MG tablet Take 1 tablet (50 mg total) by mouth daily.   . ziprasidone (GEODON) 20 MG capsule Take by mouth.   . [DISCONTINUED] propranolol ER (INDERAL LA) 160 MG SR capsule Take 1 capsule (160 mg total) by mouth daily. 02/17/2018: changed to coreg and norvasc. prn clonidine.   . ergocalciferol (DRISDOL) 50000 units capsule Take 1 capsule (50,000 Units total) by mouth once a week.    No facility-administered encounter medications on file as of 02/17/2018.     Surgical History: History reviewed. No pertinent surgical history.  Medical History: Past Medical History:  Diagnosis Date  . Bipolar 1 disorder (HCC)   . Depression   . Hypertension   . Panic attack     Family History: Family History  Adopted: Yes    Social History   Socioeconomic History  . Marital status: Single    Spouse name: Not on file  . Number of children: Not on file  . Years of education: Not on file  . Highest education level: Not on file  Occupational History  . Not on file  Social Needs  . Financial resource strain: Not on file  . Food insecurity:    Worry: Not on file    Inability: Not on file  . Transportation needs:    Medical: Not on file    Non-medical: Not on file  Tobacco Use  . Smoking status: Current Every Day Smoker    Packs/day: 0.50  . Smokeless tobacco: Never Used  Substance and Sexual Activity  . Alcohol use: Yes  . Drug use: No  . Sexual activity: Not on file  Lifestyle  . Physical  activity:    Days per week: Not on file    Minutes per session: Not on file  . Stress: Not on file  Relationships  . Social connections:    Talks on phone: Not on file    Gets together: Not on file    Attends religious service: Not on file    Active member of club or organization: Not on file    Attends meetings of clubs or organizations: Not on file    Relationship status: Not on file  . Intimate partner violence:    Fear of current or ex partner: Not on file    Emotionally abused: Not on file    Physically abused: Not on file    Forced sexual activity: Not on file  Other Topics Concern  . Not on file  Social History Narrative  . Not on file      Review of Systems  Constitutional: Negative for activity change, chills, fatigue and unexpected weight change.  HENT: Negative for congestion, postnasal drip, rhinorrhea, sneezing and sore throat.   Eyes: Negative.  Negative for redness.  Respiratory: Negative for cough, chest tightness, shortness of breath and wheezing.   Cardiovascular: Negative for chest pain and palpitations.       Blood pressure improved.   Gastrointestinal: Negative for abdominal pain, constipation, diarrhea, nausea and vomiting.  Endocrine: Negative for cold intolerance, heat intolerance, polydipsia, polyphagia and polyuria.  Genitourinary: Negative.  Negative for dysuria and frequency.  Musculoskeletal: Negative for arthralgias, back pain, joint swelling and neck pain.  Skin: Negative for rash.  Allergic/Immunologic: Negative for environmental allergies.  Neurological: Positive for headaches. Negative for tremors and numbness.  Hematological: Negative for adenopathy. Does not bruise/bleed easily.  Psychiatric/Behavioral: Positive for behavioral problems (Depression). Negative for sleep disturbance and suicidal ideas. The patient is nervous/anxious.     Today's Vitals   02/17/18 1556  BP: 138/88  Pulse: 93  Resp: 16  SpO2: 95%  Weight: 168 lb 9.6 oz  (76.5 kg)  Height: 5\' 3"  (1.6 m)    Physical Exam  Constitutional: She is oriented to person, place, and time. She appears well-developed and well-nourished. No distress.  HENT:  Head: Normocephalic and atraumatic.  Mouth/Throat: Oropharynx is clear and moist. No oropharyngeal exudate.  Eyes: Pupils are equal, round, and reactive to light. EOM are normal.  Neck: Normal range of motion. Neck supple. No thyromegaly present.  Cardiovascular: Normal rate, regular rhythm and normal heart sounds. Exam reveals no gallop.  No murmur heard. Pulmonary/Chest: Effort normal and breath sounds normal. She has no wheezes.  Abdominal: Soft. Bowel sounds are  normal. There is no tenderness.  Musculoskeletal: Normal range of motion.  Lymphadenopathy:    She has no cervical adenopathy.  Neurological: She is alert and oriented to person, place, and time. No cranial nerve deficit.  Skin: Skin is warm and dry. She is not diaphoretic.  Psychiatric: She has a normal mood and affect. Her behavior is normal. Judgment and thought content normal.  Nursing note and vitals reviewed.   Assessment/Plan: 1. Essential hypertension Blood pressure improved. Continue bp meds as prescribed.   2. Migraine without aura and without status migrainosus, not intractable Improved as bp has improved. No med changes today. Will monitor.   3. Acquired hypothyroidism Thyroid panel improved. Continue levothyroxine at current dose. Will recheck prior to next visit and adjjust levothyroxine as indicated.   4. Vitamin D deficiency - ergocalciferol (DRISDOL) 50000 units capsule; Take 1 capsule (50,000 Units total) by mouth once a week.  Dispense: 4 capsule; Refill: 5    General Counseling: Melody Moss verbalizes understanding of the findings of todays visit and agrees with plan of treatment. I have discussed any further diagnostic evaluation that may be needed or ordered today. We also reviewed her medications today. she has been  encouraged to call the office with any questions or concerns that should arise related to todays visit.   Hypertension Counseling:   The following hypertensive lifestyle modification were recommended and discussed:  1. Limiting alcohol intake to less than 1 oz/day of ethanol:(24 oz of beer or 8 oz of wine or 2 oz of 100-proof whiskey). 2. Take baby ASA 81 mg daily. 3. Importance of regular aerobic exercise and losing weight. 4. Reduce dietary saturated fat and cholesterol intake for overall cardiovascular health. 5. Maintaining adequate dietary potassium, calcium, and magnesium intake. 6. Regular monitoring of the blood pressure. 7. Reduce sodium intake to less than 100 mmol/day (less than 2.3 gm of sodium or less than 6 gm of sodium choride)   This patient was seen by Vincent GrosHeather Eliott Amparan, FNP- C in Collaboration with Dr Lyndon CodeFozia M Khan as a part of collaborative care agreement   Meds ordered this encounter  Medications  . ergocalciferol (DRISDOL) 50000 units capsule    Sig: Take 1 capsule (50,000 Units total) by mouth once a week.    Dispense:  4 capsule    Refill:  5    Order Specific Question:   Supervising Provider    Answer:   Lyndon CodeKHAN, FOZIA M [[id number]]    Time spent: 315 Minutes     Dr Lyndon CodeFozia M Khan Internal medicine

## 2018-03-13 DIAGNOSIS — G43009 Migraine without aura, not intractable, without status migrainosus: Secondary | ICD-10-CM | POA: Insufficient documentation

## 2018-03-13 DIAGNOSIS — E559 Vitamin D deficiency, unspecified: Secondary | ICD-10-CM | POA: Insufficient documentation

## 2018-03-13 DIAGNOSIS — R5383 Other fatigue: Secondary | ICD-10-CM | POA: Insufficient documentation

## 2018-03-25 ENCOUNTER — Other Ambulatory Visit
Admission: RE | Admit: 2018-03-25 | Discharge: 2018-03-25 | Disposition: A | Discharge status: Home / Self Care | Payer: Medicaid Other | Source: Ambulatory Visit | Attending: Nurse Practitioner | Admitting: Nurse Practitioner

## 2018-03-25 DIAGNOSIS — R5383 Other fatigue: Secondary | ICD-10-CM | POA: Diagnosis present

## 2018-03-25 DIAGNOSIS — E039 Hypothyroidism, unspecified: Secondary | ICD-10-CM | POA: Diagnosis not present

## 2018-03-25 LAB — CBC WITH DIFFERENTIAL/PLATELET
BASOS ABS: 0.1 10*3/uL (ref 0–0.1)
BASOS PCT: 1 %
Eosinophils Absolute: 0.2 10*3/uL (ref 0–0.7)
Eosinophils Relative: 3 %
HEMATOCRIT: 41.4 % (ref 35.0–47.0)
Hemoglobin: 14.2 g/dL (ref 12.0–16.0)
LYMPHS PCT: 26 %
Lymphs Abs: 1.8 10*3/uL (ref 1.0–3.6)
MCH: 33.1 pg (ref 26.0–34.0)
MCHC: 34.3 g/dL (ref 32.0–36.0)
MCV: 96.4 fL (ref 80.0–100.0)
MONO ABS: 0.5 10*3/uL (ref 0.2–0.9)
Monocytes Relative: 7 %
NEUTROS ABS: 4.5 10*3/uL (ref 1.4–6.5)
Neutrophils Relative %: 63 %
PLATELETS: 264 10*3/uL (ref 150–440)
RBC: 4.3 MIL/uL (ref 3.80–5.20)
RDW: 13.1 % (ref 11.5–14.5)
WBC: 7.1 10*3/uL (ref 3.6–11.0)

## 2018-03-25 LAB — TSH: TSH: 3.318 u[IU]/mL (ref 0.350–4.500)

## 2018-03-26 LAB — T4: T4 TOTAL: 6.7 ug/dL (ref 4.5–12.0)

## 2018-03-27 ENCOUNTER — Telehealth: Payer: Self-pay

## 2018-03-27 ENCOUNTER — Ambulatory Visit: Payer: Self-pay | Admitting: Nurse Practitioner

## 2018-03-27 NOTE — Telephone Encounter (Signed)
Spoke to pt and advised her thyroid level was good.  dbs

## 2018-03-30 ENCOUNTER — Ambulatory Visit: Payer: Medicaid Other | Admitting: Nurse Practitioner

## 2018-03-30 ENCOUNTER — Encounter: Payer: Self-pay | Admitting: Nurse Practitioner

## 2018-03-30 VITALS — BP 156/98 | HR 94 | Resp 16 | Ht 63.0 in | Wt 168.8 lb

## 2018-03-30 DIAGNOSIS — I10 Essential (primary) hypertension: Secondary | ICD-10-CM

## 2018-03-30 DIAGNOSIS — K219 Gastro-esophageal reflux disease without esophagitis: Secondary | ICD-10-CM

## 2018-03-30 DIAGNOSIS — L0211 Cutaneous abscess of neck: Secondary | ICD-10-CM

## 2018-03-30 DIAGNOSIS — E039 Hypothyroidism, unspecified: Secondary | ICD-10-CM

## 2018-03-30 MED ORDER — SULFAMETHOXAZOLE-TRIMETHOPRIM 800-160 MG PO TABS: 1.0000 | ORAL_TABLET | Freq: Two times a day (BID) | ORAL | 0 refills | Status: DC

## 2018-03-30 MED ORDER — PANTOPRAZOLE SODIUM 40 MG PO TBEC: 40.0000 mg | DELAYED_RELEASE_TABLET | Freq: Every day | ORAL | 3 refills | Status: DC

## 2018-03-30 NOTE — Progress Notes (Signed)
[facility] [address] [place], [place] [address]  Internal MEDICINE  Office Visit Note  Patient Name: Melody Moss  [id number]  [id number]  Date of Service: 04/22/2018   Pt is here for routine follow up.   Chief Complaint  Patient presents with  . Hypertension  . Gastroesophageal Reflux  . Hypothyroidism    The patient has concerns about new "boils" developing on left side of her neck under her arms. Continues to take doxycycline daily, but this does not seem to be controlling the break outs at this time. Boils are tender and red and will drain purulent fluid before they resolve. After resolution of one lesion, another seems to be starting.  She has also noted increase in acid reflux. She is currently on omeprazole  daily. This helped symptoms for a long time, but recently, they have been getting worse. She is having bloating, burping, and heartburn, after nearly every meal. Finally, she had labs done to monitor her thyroid. Panel is now within normal limits. Blood pressure is well controlled.       Current Medication: Outpatient Encounter Medications as of 03/30/2018  Medication Sig Note  . amLODipine (NORVASC) 5 MG tablet Take 1 tablet (5 mg total) by mouth daily.   . budesonide-formoterol (SYMBICORT) 80-4.5 MCG/ACT inhaler Inhale 2 puffs into the lungs 2 (two) times daily.   Marland Kitchen buPROPion (WELLBUTRIN XL) 150 MG 24 hr tablet TK 1 T PO QAM   . busPIRone (BUSPAR) 15 MG tablet take 1 tablet by mouth three times a day (DOSAGE INCREASE)   . butalbital-acetaminophen-caffeine (FIORICET, ESGIC) 50-325-40 MG tablet Take 1-2 tablets by mouth every 6 (six) hours as needed for headache.   . carbamazepine (CARBATROL) 300 MG 12 hr capsule TK 2 [place] PO QHS   . carvedilol (COREG) 6.25 MG tablet Take 1 tablet (6.25 mg total) by mouth 2 (two) times daily with a meal.   . cloNIDine (CATAPRES) 0.1 MG tablet Take one tab po bid prn for elevated bp more than 160   . diazepam (VALIUM) 2  MG tablet Take 1 tablet (2 mg total) by mouth every 8 (eight) hours as needed for muscle spasms.   Marland Kitchen dicyclomine (BENTYL) 20 MG tablet Take by mouth.   . ergocalciferol (DRISDOL) 50000 units capsule Take 1 capsule (50,000 Units total) by mouth once a week.   Marland Kitchen HYDROcodone-acetaminophen (NORCO/VICODIN) 5-325 MG tablet Take 1 tablet by mouth every 4 (four) hours as needed for moderate pain.   Marland Kitchen ketorolac (TORADOL) 10 MG tablet Take 1 tablet (10 mg total) by mouth every 6 (six) hours as needed for moderate pain.   Marland Kitchen LATUDA 60 MG TABS TK 1 T PO WITH DINNER   . levothyroxine (SYNTHROID, LEVOTHROID) 50 MCG tablet Take 1 tablet (50 mcg total) by mouth daily. On empty stomach   . NUEDEXTA 20-10 MG CAPS TK ONE C PO BID FOR PSEUDOBULBAR EFFECT   . pantoprazole (PROTONIX) 40 MG tablet Take 1 tablet (40 mg total) by mouth daily.   Marland Kitchen PROAIR HFA 108 (90 Base) MCG/ACT inhaler Inhale 2 puffs into the lungs every 6 (six) hours as needed for wheezing or shortness of breath.   . spironolactone (ALDACTONE) 50 MG tablet Take 1 tablet (50 mg total) by mouth daily.   Marland Kitchen sulfamethoxazole-trimethoprim (BACTRIM DS,SEPTRA DS) 800-160 MG tablet Take 1 tablet by mouth 2 (two) times daily.   . ziprasidone (GEODON) 20 MG capsule Take by mouth.   . [DISCONTINUED] doxycycline (VIBRA-TABS) 100 MG  tablet TK 1 T PO BID FOR 10 DAYS THEN TK 1 T PO QD 03/30/2018: will do 21 day course bactrim DS   . [DISCONTINUED] omeprazole (PRILOSEC) 40 MG capsule Take 1 capsule (40 mg total) by mouth daily. 03/30/2018: change to pantoprazole    No facility-administered encounter medications on file as of 03/30/2018.     Surgical History: Past Surgical History:  Procedure Laterality Date  . COSMETIC SURGERY    . LAPAROSCOPIC OOPHORECTOMY     few  times  . TONSILLECTOMY Bilateral     Medical History: Past Medical History:  Diagnosis Date  . Bipolar 1 disorder (HCC)   . Depression   . Hypertension   . Panic attack     Family  History: Family History  Adopted: Yes    Social History   Socioeconomic History  . Marital status: Single    Spouse name: Not on file  . Number of children: Not on file  . Years of education: Not on file  . Highest education level: Not on file  Occupational History  . Not on file  Social Needs  . Financial resource strain: Not on file  . Food insecurity:    Worry: Not on file    Inability: Not on file  . Transportation needs:    Medical: Not on file    Non-medical: Not on file  Tobacco Use  . Smoking status: Current Every Day Smoker    Packs/day: 0.50  . Smokeless tobacco: Never Used  Substance and Sexual Activity  . Alcohol use: Yes  . Drug use: No  . Sexual activity: Not on file  Lifestyle  . Physical activity:    Days per week: Not on file    Minutes per session: Not on file  . Stress: Not on file  Relationships  . Social connections:    Talks on phone: Not on file    Gets together: Not on file    Attends religious service: Not on file    Active member of club or organization: Not on file    Attends meetings of clubs or organizations: Not on file    Relationship status: Not on file  . Intimate partner violence:    Fear of current or ex partner: Not on file    Emotionally abused: Not on file    Physically abused: Not on file    Forced sexual activity: Not on file  Other Topics Concern  . Not on file  Social History Narrative  . Not on file      Review of Systems  Constitutional: Positive for fatigue. Negative for activity change, chills and unexpected weight change.  HENT: Negative for congestion, postnasal drip, rhinorrhea, sneezing and sore throat.   Eyes: Negative.  Negative for redness.  Respiratory: Negative for cough, chest tightness, shortness of breath and wheezing.   Cardiovascular: Negative for chest pain and palpitations.       Blood pressure improved.   Gastrointestinal: Positive for abdominal distention. Negative for abdominal pain,  constipation, diarrhea, nausea and vomiting.       Increased acid reflux symptoms.   Endocrine: Negative for cold intolerance, heat intolerance, polydipsia, polyphagia and polyuria.  Genitourinary: Negative.  Negative for dysuria and frequency.  Musculoskeletal: Negative for arthralgias, back pain, joint swelling and neck pain.  Skin: Negative for rash.       Single red, tender lesion on the left part of her neck. Has some clear drainage present as well as fluid which has dried.  She also has a few tender lesions under her arms.   Allergic/Immunologic: Negative for environmental allergies.  Neurological: Positive for headaches. Negative for tremors and numbness.  Hematological: Negative for adenopathy. Does not bruise/bleed easily.  Psychiatric/Behavioral: Positive for behavioral problems (Depression). Negative for sleep disturbance and suicidal ideas. The patient is nervous/anxious.        Psychiatric disorder managed by mental health provider.     Vital Signs: BP (!) 156/98   Pulse 94   Resp 16   Ht  (1.6 m)   Wt 168 lb 12.8 oz (76.6 kg)   SpO2 94%   BMI 29.90 kg/m    Physical Exam  Constitutional: She is oriented to person, place, and time. She appears well-developed and well-nourished. No distress.  HENT:  Head: Normocephalic and atraumatic.  Mouth/Throat: Oropharynx is clear and moist. No oropharyngeal exudate.  Eyes: Pupils are equal, round, and reactive to light. Conjunctivae and EOM are normal.  Neck: Normal range of motion. Neck supple. No thyromegaly present.  Cardiovascular: Normal rate, regular rhythm and normal heart sounds. Exam reveals no gallop.  No murmur heard. Pulmonary/Chest: Effort normal and breath sounds normal. She has no wheezes.  Abdominal: Soft. Bowel sounds are normal. She exhibits distension. There is tenderness.  Musculoskeletal: Normal range of motion.  Lymphadenopathy:    She has no cervical adenopathy.  Neurological: She is alert and  oriented to person, place, and time. No cranial nerve deficit.  Skin: Skin is warm and dry. She is not diaphoretic.  Single, red, round, lesion, on left anterior neck. pustular in nature. Some clear fluid present as well as some dried fluid. Few, small, red, round, pustular lesions in axillary region. A few are scabbed over with new lesions developing. Hurts to put her arm down by her side. Currently not draining.   Psychiatric: She has a normal mood and affect. Her behavior is normal. Judgment and thought content normal.  Nursing note and vitals reviewed.  Assessment/Plan: 1. Abscess of skin of neck Hold doxycycline for now. Start Bactrim DS bid for 21 days. May need to return to doxycycline after completion of Bactrim.  - sulfamethoxazole-trimethoprim (BACTRIM DS,SEPTRA DS) 800-160 MG tablet; Take 1 tablet by mouth 2 (two) times daily.  Dispense: 42 tablet; Refill: 0  2. Gastroesophageal reflux disease without esophagitis D/c omeprazole. Start pantoprazole  daily. May need referral to GI if symptoms are persistent.  - pantoprazole (PROTONIX) 40 MG tablet; Take 1 tablet (40 mg total) by mouth daily.  Dispense: 30 tablet; Refill: 3  3. Acquired hypothyroidism Thyroid panel stable. Continue levothyroxine as prescribed  4. Essential hypertension, malignant Improving. Continue BP medication as prescribed.   General Counseling: Meelah verbalizes understanding of the findings of todays visit and agrees with plan of treatment. I have discussed any further diagnostic evaluation that may be needed or ordered today. We also reviewed her medications today. she has been encouraged to call the office with any questions or concerns that should arise related to todays visit.    Counseling:  This patient was seen by Vincent Gros, FNP- C in Collaboration with Dr Lyndon Code as a part of collaborative care agreement  Meds ordered this encounter  Medications  . pantoprazole (PROTONIX) 40 MG tablet     Sig: Take 1 tablet (40 mg total) by mouth daily.    Dispense:  30 tablet    Refill:  3    Please d/c omeprazole. No longer effective.    Order Specific Question:  Supervising Provider    Answer:   Lyndon Code [[id number]]  . sulfamethoxazole-trimethoprim (BACTRIM DS,SEPTRA DS) 800-160 MG tablet    Sig: Take 1 tablet by mouth 2 (two) times daily.    Dispense:  42 tablet    Refill:  0    Hold doxycycline while on bactrim. Return to daily dosing doxycycline after completion of bactrim.    Order Specific Question:   Supervising Provider    Answer:   Lyndon Code [[id number]]    Time spent: 67 Minutes     Dr Lyndon Code Internal medicine

## 2018-04-22 DIAGNOSIS — L0211 Cutaneous abscess of neck: Secondary | ICD-10-CM | POA: Insufficient documentation

## 2018-04-22 DIAGNOSIS — K219 Gastro-esophageal reflux disease without esophagitis: Secondary | ICD-10-CM | POA: Insufficient documentation

## 2018-05-08 ENCOUNTER — Other Ambulatory Visit: Payer: Self-pay

## 2018-05-08 MED ORDER — DOXYCYCLINE HYCLATE 100 MG PO TABS: 100.0000 mg | ORAL_TABLET | Freq: Every day | ORAL | 3 refills | Status: DC

## 2018-06-18 ENCOUNTER — Other Ambulatory Visit: Payer: Self-pay | Admitting: Internal Medicine

## 2018-06-18 DIAGNOSIS — I10 Essential (primary) hypertension: Secondary | ICD-10-CM

## 2018-06-19 ENCOUNTER — Other Ambulatory Visit: Payer: Self-pay

## 2018-06-19 DIAGNOSIS — I10 Essential (primary) hypertension: Secondary | ICD-10-CM

## 2018-06-19 MED ORDER — CARVEDILOL 6.25 MG PO TABS: ORAL_TABLET | ORAL | 1 refills | Status: DC

## 2018-08-07 ENCOUNTER — Encounter: Payer: Self-pay | Admitting: Nurse Practitioner

## 2018-08-07 ENCOUNTER — Ambulatory Visit: Payer: Medicaid Other | Admitting: Nurse Practitioner

## 2018-08-07 VITALS — BP 161/92 | HR 82 | Resp 16 | Ht 63.0 in | Wt 172.2 lb

## 2018-08-07 DIAGNOSIS — I83893 Varicose veins of bilateral lower extremities with other complications: Secondary | ICD-10-CM

## 2018-08-07 DIAGNOSIS — E039 Hypothyroidism, unspecified: Secondary | ICD-10-CM

## 2018-08-07 DIAGNOSIS — L0211 Cutaneous abscess of neck: Secondary | ICD-10-CM

## 2018-08-07 DIAGNOSIS — I10 Essential (primary) hypertension: Secondary | ICD-10-CM

## 2018-08-07 MED ORDER — SULFAMETHOXAZOLE-TRIMETHOPRIM 800-160 MG PO TABS: ORAL_TABLET | ORAL | 1 refills | Status: DC

## 2018-08-07 NOTE — Progress Notes (Signed)
[facility] [address] [place], [place] [address]  Internal MEDICINE  Office Visit Note  Patient Name: Melody Moss  [id number]  [id number]  Date of Service: 08/16/2018  Chief Complaint  Patient presents with  . Gastroesophageal Reflux    4 month follow up  . Hypertension  . Hypothyroidism  . Recurrent Skin Infections  . Depression    pt stated the medication doesnt seem to be working    1. Swelling and pain in bilateral lower extremities. Intermittent. More when she is up on her feet for long periods of time or when she is sitting with her legs dangling. Has tried compression socks but these did not work very well. When they get swollen, her legs and feet will hurt so much she can hardly walk. 2. Collection of small lesions/abscesses present on left side of her neck, buttocks, and on her inner thighs. Continues to take doxycycline 100mg  daily. Lesions continue to appear.  3. Fatigue. losing her "get up and go." does what she has to, but really is struggling with her energy.  4. Insect bite to her right breast/upper abdomen area. Very itchy. Turned very red, then black and blue. Can see where the insect bit her.  5. "twinges" every so often in left chest area.       Current Medication: Outpatient Encounter Medications as of 08/07/2018  Medication Sig  . amLODipine (NORVASC) 5 MG tablet Take 1 tablet (5 mg total) by mouth daily.  . budesonide-formoterol (SYMBICORT) 80-4.5 MCG/ACT inhaler Inhale 2 puffs into the lungs 2 (two) times daily.  Marland Kitchen buPROPion (WELLBUTRIN XL) 150 MG 24 hr tablet TK 1 T PO QAM  . busPIRone (BUSPAR) 15 MG tablet take 1 tablet by mouth three times a day (DOSAGE INCREASE)  . butalbital-acetaminophen-caffeine (FIORICET, ESGIC) 50-325-40 MG tablet Take 1-2 tablets by mouth every 6 (six) hours as needed for headache.  . carbamazepine (CARBATROL) 300 MG 12 hr capsule TK 2 Magness PO QHS  . carvedilol (COREG) 6.25 MG tablet TAKE 1 TABLET(6.25 MG) BY MOUTH  TWICE DAILY WITH A MEAL  . cloNIDine (CATAPRES) 0.1 MG tablet Take one tab po bid prn for elevated bp more than 160  . diazepam (VALIUM) 2 MG tablet Take 1 tablet (2 mg total) by mouth every 8 (eight) hours as needed for muscle spasms.  Marland Kitchen dicyclomine (BENTYL) 20 MG tablet Take by mouth.  . ergocalciferol (DRISDOL) 50000 units capsule Take 1 capsule (50,000 Units total) by mouth once a week.  Marland Kitchen HYDROcodone-acetaminophen (NORCO/VICODIN) 5-325 MG tablet Take 1 tablet by mouth every 4 (four) hours as needed for moderate pain.  Marland Kitchen ketorolac (TORADOL) 10 MG tablet Take 1 tablet (10 mg total) by mouth every 6 (six) hours as needed for moderate pain.  Marland Kitchen LATUDA 60 MG TABS TK 1 T PO WITH DINNER  . levothyroxine (SYNTHROID, LEVOTHROID) 50 MCG tablet Take 1 tablet (50 mcg total) by mouth daily. On empty stomach  . NUEDEXTA 20-10 MG CAPS TK ONE C PO BID FOR PSEUDOBULBAR EFFECT  . pantoprazole (PROTONIX) 40 MG tablet Take 1 tablet (40 mg total) by mouth daily.  Marland Kitchen PROAIR HFA 108 (90 Base) MCG/ACT inhaler Inhale 2 puffs into the lungs every 6 (six) hours as needed for wheezing or shortness of breath.  . spironolactone (ALDACTONE) 50 MG tablet Take 1 tablet (50 mg total) by mouth daily.  . ziprasidone (GEODON) 20 MG capsule Take by mouth.  . [DISCONTINUED] doxycycline (VIBRA-TABS) 100 MG tablet Take 1 tablet (  100 mg total) by mouth daily.  . [DISCONTINUED] sulfamethoxazole-trimethoprim (BACTRIM DS,SEPTRA DS) 800-160 MG tablet Take 1 tablet by mouth 2 (two) times daily.  Marland Kitchen. sulfamethoxazole-trimethoprim (BACTRIM DS,SEPTRA DS) 800-160 MG tablet Take 1 tablet po BID for 3 weeks, then take  Tablet po QD   No facility-administered encounter medications on file as of 08/07/2018.     Surgical History: Past Surgical History:  Procedure Laterality Date  . COSMETIC SURGERY    . LAPAROSCOPIC OOPHORECTOMY     few  times  . TONSILLECTOMY Bilateral     Medical History: Past Medical History:  Diagnosis Date  . Bipolar  1 disorder (HCC)   . Depression   . Hypertension   . Panic attack     Family History: Family History  Adopted: Yes    Social History   Socioeconomic History  . Marital status: Single    Spouse name: Not on file  . Number of children: Not on file  . Years of education: Not on file  . Highest education level: Not on file  Occupational History  . Not on file  Social Needs  . Financial resource strain: Not on file  . Food insecurity:    Worry: Not on file    Inability: Not on file  . Transportation needs:    Medical: Not on file    Non-medical: Not on file  Tobacco Use  . Smoking status: Current Every Day Smoker    Packs/day: 0.50    Types: Cigarettes  . Smokeless tobacco: Never Used  Substance and Sexual Activity  . Alcohol use: Yes  . Drug use: No  . Sexual activity: Not on file  Lifestyle  . Physical activity:    Days per week: Not on file    Minutes per session: Not on file  . Stress: Not on file  Relationships  . Social connections:    Talks on phone: Not on file    Gets together: Not on file    Attends religious service: Not on file    Active member of club or organization: Not on file    Attends meetings of clubs or organizations: Not on file    Relationship status: Not on file  . Intimate partner violence:    Fear of current or ex partner: Not on file    Emotionally abused: Not on file    Physically abused: Not on file    Forced sexual activity: Not on file  Other Topics Concern  . Not on file  Social History Narrative  . Not on file      Review of Systems  Constitutional: Positive for fatigue. Negative for activity change, chills and unexpected weight change.  HENT: Negative for congestion, postnasal drip, rhinorrhea, sneezing and sore throat.   Eyes: Negative.  Negative for redness.  Respiratory: Negative for cough, chest tightness, shortness of breath and wheezing.   Cardiovascular: Negative for chest pain and palpitations.       Blood  pressure elevated today.  Gastrointestinal: Positive for abdominal distention. Negative for abdominal pain, constipation, diarrhea, nausea and vomiting.       Increased acid reflux symptoms.   Endocrine: Negative for cold intolerance, heat intolerance, polydipsia, polyphagia and polyuria.       Needs to have her thyroid panel checked   Genitourinary: Negative.  Negative for dysuria and frequency.  Musculoskeletal: Negative for arthralgias, back pain, joint swelling and neck pain.  Skin: Negative for rash.       Single red,  tender lesion on the left part of her neck. Has some clear drainage present as well as fluid which has dried. She also has a few tender lesions under her arms. They are also present on her buttock and upper legs. Currently taking doxycycline 100mg  daily to prevent flares of skin infection.   Allergic/Immunologic: Negative for environmental allergies.  Neurological: Positive for headaches. Negative for dizziness, tremors and numbness.  Hematological: Negative for adenopathy. Does not bruise/bleed easily.  Psychiatric/Behavioral: Positive for behavioral problems (Depression). Negative for sleep disturbance and suicidal ideas. The patient is nervous/anxious.        Psychiatric disorder managed by mental health provider.     Today's Vitals   08/07/18 1557  BP: (!) 161/92  Pulse: 82  Resp: 16  SpO2: 96%  Weight: 172 lb 3.2 oz (78.1 kg)  Height: 5\' 3"  (1.6 m)    Physical Exam  Constitutional: She is oriented to person, place, and time. She appears well-developed and well-nourished. No distress.  HENT:  Head: Normocephalic and atraumatic.  Nose: Nose normal.  Mouth/Throat: Oropharynx is clear and moist. No oropharyngeal exudate.  Eyes: Pupils are equal, round, and reactive to light. Conjunctivae and EOM are normal.  Neck: Normal range of motion. Neck supple. No JVD present. No tracheal deviation present. No thyromegaly present.  Cardiovascular: Normal rate, regular  rhythm and normal heart sounds. Exam reveals no gallop.  No murmur heard. Varicose veins present in bilateral lower extremitites .palpable pedal pulses bilaterally. Capillary refill <3 seconds in both feet.   Pulmonary/Chest: Effort normal and breath sounds normal. She has no wheezes.  Abdominal: Soft. Bowel sounds are normal. She exhibits no distension. There is no tenderness.  Musculoskeletal: Normal range of motion.  Lymphadenopathy:    She has no cervical adenopathy.  Neurological: She is alert and oriented to person, place, and time. No cranial nerve deficit.  Skin: Skin is warm and dry. She is not diaphoretic.  Single, red, round, lesion, on left anterior neck. pustular in nature. Some clear fluid present as well as some dried fluid. Few, small, red, round, pustular lesions in axillary region. A few are scabbed over with new lesions developing. Hurts to put her arm down by her side. Currently not draining. There are a few new, similar lesions of bilateral buttocks and upper legs .  Psychiatric: Her speech is normal and behavior is normal. Judgment and thought content normal. Her mood appears anxious. Cognition and memory are normal.  Nursing note and vitals reviewed.  Assessment/Plan: 1. Essential hypertension Generally stable. No changes made in bp medication today. Monitor closely.  2. Hypothyroidism, unspecified type Check thyroid panel and adjust levothyroxine as indicated.   3. Abscess of skin of neck D/c doxycycline for now. Start Bactrim DS bid for next 30 days. May consider referral to dermatology if no improvement. - sulfamethoxazole-trimethoprim (BACTRIM DS,SEPTRA DS) 800-160 MG tablet; Take 1 tablet po BID for 3 weeks, then take  Tablet po QD  Dispense: 60 tablet; Refill: 1  4. Varicose veins of both legs with edema Will ultrasound bilateral lower extremities for further evaluation  - VAS [place] LOWER EXTREMITY VENOUS REFLUX; Future  General Counseling: Emilynn verbalizes  understanding of the findings of todays visit and agrees with plan of treatment. I have discussed any further diagnostic evaluation that may be needed or ordered today. We also reviewed her medications today. she has been encouraged to call the office with any questions or concerns that should arise related to todays visit.  Hypertension Counseling:   The following hypertensive lifestyle modification were recommended and discussed:  1. Limiting alcohol intake to less than 1 oz/day of ethanol:(24 oz of beer or 8 oz of wine or 2 oz of 100-proof whiskey). 2. Take baby ASA 81 mg daily. 3. Importance of regular aerobic exercise and losing weight. 4. Reduce dietary saturated fat and cholesterol intake for overall cardiovascular health. 5. Maintaining adequate dietary potassium, calcium, and magnesium intake. 6. Regular monitoring of the blood pressure. 7. Reduce sodium intake to less than 100 mmol/day (less than 2.3 gm of sodium or less than 6 gm of sodium choride)   This patient was seen by Vincent Gros FNP Collaboration with Dr Lyndon Code as a part of collaborative care agreement   Meds ordered this encounter  Medications  . sulfamethoxazole-trimethoprim (BACTRIM DS,SEPTRA DS) 800-160 MG tablet    Sig: Take 1 tablet po BID for 3 weeks, then take  Tablet po QD    Dispense:  60 tablet    Refill:  1    D/c doxycycline for now.    Order Specific Question:   Supervising Provider    Answer:   Lyndon Code [[id number]]    Time spent: 71 Minutes      Dr Lyndon Code Internal medicine

## 2018-08-16 DIAGNOSIS — I83893 Varicose veins of bilateral lower extremities with other complications: Secondary | ICD-10-CM | POA: Insufficient documentation

## 2018-08-17 ENCOUNTER — Other Ambulatory Visit: Payer: Self-pay | Admitting: Nurse Practitioner

## 2018-08-17 ENCOUNTER — Other Ambulatory Visit: Payer: Self-pay | Admitting: Internal Medicine

## 2018-08-17 DIAGNOSIS — I10 Essential (primary) hypertension: Secondary | ICD-10-CM

## 2018-08-17 MED ORDER — AMLODIPINE BESYLATE 5 MG PO TABS
ORAL_TABLET | ORAL | 2 refills | Status: DC
Start: 2018-08-17 — End: 2019-06-10

## 2018-08-21 ENCOUNTER — Ambulatory Visit: Payer: Medicaid Other

## 2018-08-21 ENCOUNTER — Other Ambulatory Visit
Admission: RE | Admit: 2018-08-21 | Discharge: 2018-08-21 | Disposition: A | Discharge status: Home / Self Care | Payer: Medicaid Other | Source: Ambulatory Visit | Attending: Nurse Practitioner | Admitting: Nurse Practitioner

## 2018-08-21 DIAGNOSIS — E559 Vitamin D deficiency, unspecified: Secondary | ICD-10-CM | POA: Diagnosis not present

## 2018-08-21 DIAGNOSIS — E039 Hypothyroidism, unspecified: Secondary | ICD-10-CM | POA: Diagnosis not present

## 2018-08-21 DIAGNOSIS — R5383 Other fatigue: Secondary | ICD-10-CM | POA: Diagnosis not present

## 2018-08-21 DIAGNOSIS — D509 Iron deficiency anemia, unspecified: Secondary | ICD-10-CM | POA: Insufficient documentation

## 2018-08-21 DIAGNOSIS — I868 Varicose veins of other specified sites: Secondary | ICD-10-CM

## 2018-08-21 DIAGNOSIS — I83893 Varicose veins of bilateral lower extremities with other complications: Secondary | ICD-10-CM

## 2018-08-21 LAB — CBC
HCT: 44.3 % (ref 35.0–47.0)
Hemoglobin: 14.5 g/dL (ref 12.0–16.0)
MCH: 31.4 pg (ref 26.0–34.0)
MCHC: 32.8 g/dL (ref 32.0–36.0)
MCV: 95.7 fL (ref 80.0–100.0)
PLATELETS: 257 10*3/uL (ref 150–440)
RBC: 4.63 MIL/uL (ref 3.80–5.20)
RDW: 13.4 % (ref 11.5–14.5)
WBC: 6 10*3/uL (ref 3.6–11.0)

## 2018-08-21 LAB — BASIC METABOLIC PANEL
Anion gap: 8 (ref 5–15)
BUN: 15 mg/dL (ref 6–20)
CALCIUM: 9.1 mg/dL (ref 8.9–10.3)
CO2: 22 mmol/L (ref 22–32)
Chloride: 107 mmol/L (ref 98–111)
Creatinine, Ser: 1.13 mg/dL — ABNORMAL HIGH (ref 0.44–1.00)
GFR calc non Af Amer: 54 mL/min — ABNORMAL LOW (ref >60)
Glucose, Bld: 120 mg/dL — ABNORMAL HIGH (ref 70–99)
Potassium: 4.4 mmol/L (ref 3.5–5.1)
SODIUM: 137 mmol/L (ref 135–145)

## 2018-08-21 LAB — TSH: TSH: 3.825 u[IU]/mL (ref 0.350–4.500)

## 2018-08-21 LAB — VITAMIN B12: Vitamin B-12: 210 pg/mL (ref 180–914)

## 2018-08-21 LAB — FERRITIN: Ferritin: 85 ng/mL (ref 11–307)

## 2018-08-21 LAB — FOLATE: Folate: 9.1 ng/mL (ref >5.9)

## 2018-08-21 LAB — T4, FREE: FREE T4: 0.81 ng/dL — AB (ref 0.82–1.77)

## 2018-08-22 LAB — VITAMIN D 25 HYDROXY (VIT D DEFICIENCY, FRACTURES): VIT D 25 HYDROXY: 36.7 ng/mL (ref 30.0–100.0)

## 2018-09-08 ENCOUNTER — Encounter: Payer: Self-pay | Admitting: Nurse Practitioner

## 2018-09-08 ENCOUNTER — Ambulatory Visit: Payer: Medicaid Other | Admitting: Nurse Practitioner

## 2018-09-08 VITALS — BP 138/82 | HR 90 | Resp 16 | Ht 63.0 in | Wt 173.4 lb

## 2018-09-08 DIAGNOSIS — L0211 Cutaneous abscess of neck: Secondary | ICD-10-CM

## 2018-09-08 DIAGNOSIS — I83893 Varicose veins of bilateral lower extremities with other complications: Secondary | ICD-10-CM

## 2018-09-08 DIAGNOSIS — I10 Essential (primary) hypertension: Secondary | ICD-10-CM

## 2018-09-08 DIAGNOSIS — E039 Hypothyroidism, unspecified: Secondary | ICD-10-CM

## 2018-09-08 DIAGNOSIS — R0789 Other chest pain: Secondary | ICD-10-CM

## 2018-09-08 DIAGNOSIS — R071 Chest pain on breathing: Secondary | ICD-10-CM

## 2018-09-08 DIAGNOSIS — M791 Myalgia, unspecified site: Secondary | ICD-10-CM

## 2018-09-08 MED ORDER — ETODOLAC 400 MG PO TABS: 400.0000 mg | ORAL_TABLET | Freq: Two times a day (BID) | ORAL | 1 refills | Status: DC | PRN

## 2018-09-08 MED ORDER — TIZANIDINE HCL 2 MG PO TABS: 2.0000 mg | ORAL_TABLET | Freq: Three times a day (TID) | ORAL | 0 refills | Status: DC | PRN

## 2018-09-08 MED ORDER — TRAMADOL HCL 50 MG PO TABS: 50.0000 mg | ORAL_TABLET | Freq: Two times a day (BID) | ORAL | 0 refills | Status: DC | PRN

## 2018-09-08 MED ORDER — SULFAMETHOXAZOLE-TRIMETHOPRIM 800-160 MG PO TABS
ORAL_TABLET | ORAL | 1 refills | Status: DC
Start: 2018-09-08 — End: 2018-12-08

## 2018-09-08 MED ORDER — DICLOFENAC SODIUM 1 % TD GEL: 4.0000 g | Freq: Four times a day (QID) | TRANSDERMAL | 3 refills | Status: DC

## 2018-09-08 NOTE — Progress Notes (Signed)
[facility] [address] [place], [place] [address]  Internal MEDICINE  Office Visit Note  Patient Name: Melody Moss  [id number]  [id number]  Date of Service: 09/09/2018  Chief Complaint  Patient presents with  . Medical Management of Chronic Issues    4 week follow up  . Labs Only    review lab and ultrasound results  . Hypertension    The patient states that she has been having severe cramp/pain, located at right lower rib cage area. Hurts much more with position changes, twisting or bending at the waist. She has had this happen in the past, but generally, it has been on the left side. She has been seen in the ER for this in the past, probably two times in the past 3 years. Most of the time, this is not an issue, but when this acts up, the pain can be debilitating. Just can't move or take a deep breath without having severe pain. She denies injury or trauma to the area. States that she has been doing a lot of lifting and physical activity around the home, and she thinks this may have triggered this current episode.  She continues to have a cyst-like lesion on the left side of her neck. Did get some better with antibiotic treatment, but seems to come right back after she completes the course. She has another, similar lesion on the back of her right shoulder. She is interested in seeing a dermatologist for further evaluation and treatment.  She has had labs done and ultrasound of her lower legs since she was last seen. Ultrasound of her legs was normal and does not show any evidence of abnormalities, DVTs, or Baker's cysts. Labs also looked good.       Current Medication: Outpatient Encounter Medications as of 09/08/2018  Medication Sig  . amLODipine (NORVASC) 5 MG tablet TAKE 1 TABLET(5 MG) BY MOUTH DAILY  . budesonide-formoterol (SYMBICORT) 80-4.5 MCG/ACT inhaler Inhale 2 puffs into the lungs 2 (two) times daily.  Marland Kitchen buPROPion (WELLBUTRIN XL) 150 MG 24 hr tablet TK 1 T  PO QAM  . busPIRone (BUSPAR) 15 MG tablet take 1 tablet by mouth three times a day (DOSAGE INCREASE)  . carbamazepine (CARBATROL) 300 MG 12 hr capsule TK 2 L[place]cher PO QHS  . carvedilol (COREG) 6.25 MG tablet TAKE 1 TABLET(6.25 MG) BY MOUTH TWICE DAILY WITH A MEAL  . cloNIDine (CATAPRES) 0.1 MG tablet Take one tab po bid prn for elevated bp more than 160  . dicyclomine (BENTYL) 20 MG tablet Take by mouth.  . ergocalciferol (DRISDOL) 50000 units capsule Take 1 capsule (50,000 Units total) by mouth once a week.  Marland Kitchen LATUDA 60 MG TABS TK 1 T PO WITH DINNER  . levothyroxine (SYNTHROID, LEVOTHROID) 50 MCG tablet Take 1 tablet (50 mcg total) by mouth daily. On empty stomach  . NUEDEXTA 20-10 MG CAPS TK ONE C PO BID FOR PSEUDOBULBAR EFFECT  . pantoprazole (PROTONIX) 40 MG tablet Take 1 tablet (40 mg total) by mouth daily.  Marland Kitchen PROAIR HFA 108 (90 Base) MCG/ACT inhaler Inhale 2 puffs into the lungs every 6 (six) hours as needed for wheezing or shortness of breath.  . spironolactone (ALDACTONE) 50 MG tablet Take 1 tablet (50 mg total) by mouth daily.  Marland Kitchen sulfamethoxazole-trimethoprim (BACTRIM DS,SEPTRA DS) 800-160 MG tablet Take 1 tablet po BID for 3 weeks, then take  Tablet po QD  . ziprasidone (GEODON) 20 MG capsule Take by mouth.  . [DISCONTINUED] butalbital-acetaminophen-caffeine (  FIORICET, ESGIC) 50-325-40 MG tablet Take 1-2 tablets by mouth every 6 (six) hours as needed for headache.  . [DISCONTINUED] diazepam (VALIUM) 2 MG tablet Take 1 tablet (2 mg total) by mouth every 8 (eight) hours as needed for muscle spasms.  . [DISCONTINUED] HYDROcodone-acetaminophen (NORCO/VICODIN) 5-325 MG tablet Take 1 tablet by mouth every 4 (four) hours as needed for moderate pain.  . [DISCONTINUED] ketorolac (TORADOL) 10 MG tablet Take 1 tablet (10 mg total) by mouth every 6 (six) hours as needed for moderate pain.  . [DISCONTINUED] sulfamethoxazole-trimethoprim (BACTRIM DS,SEPTRA DS) 800-160 MG tablet Take 1 tablet po BID for 3  weeks, then take  Tablet po QD  . diclofenac sodium (VOLTAREN) 1 % GEL Apply 4 g topically 4 (four) times daily.  Marland Kitchen etodolac (LODINE) 400 MG tablet Take 1 tablet (400 mg total) by mouth 2 (two) times daily as needed.  Marland Kitchen tiZANidine (ZANAFLEX) 2 MG tablet Take 1 tablet (2 mg total) by mouth every 8 (eight) hours as needed for muscle spasms.  . traMADol (ULTRAM) 50 MG tablet Take 1 tablet (50 mg total) by mouth every 12 (twelve) hours as needed.   No facility-administered encounter medications on file as of 09/08/2018.     Surgical History: Past Surgical History:  Procedure Laterality Date  . COSMETIC SURGERY    . LAPAROSCOPIC OOPHORECTOMY     few  times  . TONSILLECTOMY Bilateral     Medical History: Past Medical History:  Diagnosis Date  . Bipolar 1 disorder (HCC)   . Depression   . Hypertension   . Panic attack     Family History: Family History  Adopted: Yes    Social History   Socioeconomic History  . Marital status: Single    Spouse name: Not on file  . Number of children: Not on file  . Years of education: Not on file  . Highest education level: Not on file  Occupational History  . Not on file  Social Needs  . Financial resource strain: Not on file  . Food insecurity:    Worry: Not on file    Inability: Not on file  . Transportation needs:    Medical: Not on file    Non-medical: Not on file  Tobacco Use  . Smoking status: Current Every Day Smoker    Packs/day: 0.50    Types: Cigarettes  . Smokeless tobacco: Never Used  Substance and Sexual Activity  . Alcohol use: Yes  . Drug use: No  . Sexual activity: Not on file  Lifestyle  . Physical activity:    Days per week: Not on file    Minutes per session: Not on file  . Stress: Not on file  Relationships  . Social connections:    Talks on phone: Not on file    Gets together: Not on file    Attends religious service: Not on file    Active member of club or organization: Not on file    Attends  meetings of clubs or organizations: Not on file    Relationship status: Not on file  . Intimate partner violence:    Fear of current or ex partner: Not on file    Emotionally abused: Not on file    Physically abused: Not on file    Forced sexual activity: Not on file  Other Topics Concern  . Not on file  Social History Narrative  . Not on file      Review of Systems  Constitutional: Positive  for fatigue. Negative for activity change, chills and unexpected weight change.  HENT: Negative for congestion, postnasal drip, rhinorrhea, sneezing and sore throat.   Eyes: Negative.  Negative for redness.  Respiratory: Negative for cough, chest tightness, shortness of breath and wheezing.   Cardiovascular: Negative for chest pain and palpitations.  Gastrointestinal: Negative for abdominal distention, abdominal pain, constipation, diarrhea, nausea and vomiting.       Acid reflux symptoms have improved some since she was last seen.   Endocrine: Negative for cold intolerance, heat intolerance, polydipsia, polyphagia and polyuria.       Thyroid panel appear stable.   Genitourinary: Negative.  Negative for dysuria and frequency.  Musculoskeletal: Positive for myalgias. Negative for arthralgias, back pain, joint swelling and neck pain.       Mostly along the right lowe rib cage and right posterior shoulder.  Skin: Negative for rash.       Single red, tender lesion on the left part of her neck. Has some clear drainage present as well as fluid which has dried. She has noted another raised lesion on the back of her left shoulder. Will get larger, then drain fluid, and get smaller again.   Allergic/Immunologic: Negative for environmental allergies.  Neurological: Negative for dizziness, tremors, numbness and headaches.  Hematological: Negative for adenopathy. Does not bruise/bleed easily.  Psychiatric/Behavioral: Positive for behavioral problems (Depression). Negative for sleep disturbance and suicidal  ideas. The patient is nervous/anxious.        Psychiatric disorder managed by mental health provider.     Vital Signs: Today's Vitals   09/08/18 1605  BP: 138/82  Pulse: 90  Resp: 16  SpO2: 95%  Weight: 173 lb 6.4 oz (78.7 kg)  Height: 5\' 3"  (1.6 m)     Physical Exam  Constitutional: She is oriented to person, place, and time. She appears well-developed and well-nourished. No distress.  HENT:  Head: Normocephalic and atraumatic.  Nose: Nose normal.  Mouth/Throat: Oropharynx is clear and moist. No oropharyngeal exudate.  Eyes: Pupils are equal, round, and reactive to light. Conjunctivae and EOM are normal.  Neck: Normal range of motion. Neck supple. No JVD present. No tracheal deviation present. No thyromegaly present.  Cardiovascular: Normal rate, regular rhythm and normal heart sounds. Exam reveals no gallop.  No murmur heard. Varicose veins present in bilateral lower extremitites .palpable pedal pulses bilaterally.   Pulmonary/Chest: Effort normal and breath sounds normal. She has no wheezes.  Abdominal: Soft. Bowel sounds are normal. She exhibits no distension. There is no tenderness.  Musculoskeletal: Normal range of motion.  There is moderate tenderness with palpation along the posterior, lower aspect of the right shoulder, stretches around the right lower rib cage. There is no bruising or swelling. No crepitus with palpation. Grimacing noted with coughing or sneezing. Also grimacing with twisting or bending at the waist.   Lymphadenopathy:    She has no cervical adenopathy.  Neurological: She is alert and oriented to person, place, and time. No cranial nerve deficit.  Skin: Skin is warm and dry. She is not diaphoretic.  Single, red, round, lesion, on left anterior neck. pustular in nature. Some clear fluid present as well as some dried fluid. Second, oil-filled cyst on posterior left shoulder. Currently scabbed. Round and smooth borders present. There is no drainage at this  time.   Psychiatric: Her speech is normal and behavior is normal. Judgment and thought content normal. Her mood appears anxious. Cognition and memory are normal.  Nursing note and vitals  reviewed.  Assessment/Plan: 1. Costochondral pain Start etodolac 400mg  twice daily as needed for pain/inflammation. May also apply voltaren gel up to four times daily to affected areas as needed to help with pain/inflammation. A short term prescription for tramadol 50mg  was given to help severe pain. Reviewed risk factors and side effects associated with taking this medication. She and her husband voiced understanding.  - traMADol (ULTRAM) 50 MG tablet; Take 1 tablet (50 mg total) by mouth every 12 (twelve) hours as needed.  Dispense: 20 tablet; Refill: 0 - diclofenac sodium (VOLTAREN) 1 % GEL; Apply 4 g topically 4 (four) times daily.  Dispense: 100 g; Refill: 3 - etodolac (LODINE) 400 MG tablet; Take 1 tablet (400 mg total) by mouth 2 (two) times daily as needed.  Dispense: 60 tablet; Refill: 1  2. Muscle pain Add tizanidine 2mg  tables. She may take1/2 to 1 tablet up to three times daily as needed to relieve muscle pain and spasms. Advised her to use with cuation as it might cause dizziness or drowsiness. - tiZANidine (ZANAFLEX) 2 MG tablet; Take 1 tablet (2 mg total) by mouth every 8 (eight) hours as needed for muscle spasms.  Dispense: 30 tablet; Refill: 0  3. Abscess of skin of neck Repeat treatment with bactrim DS twice daily. Refer to dermatology for further evaluation and treatment, as lesions are persistent and not completely responsive to antibiotic treatment.  - Ambulatory referral to Dermatology - sulfamethoxazole-trimethoprim (BACTRIM DS,SEPTRA DS) 800-160 MG tablet; Take 1 tablet po BID for 3 weeks, then take  Tablet po QD  Dispense: 60 tablet; Refill: 1  4. Varicose veins of both legs with edema Reviewed ultrasound which was negative for abnormalities. Advised her to wear compression socks to  provide support. Encouraged her to elevate her legs when possible and drink plenty of water.   5. Acquired hypothyroidism Thyroid panel stable. Will continue to monitor.   6. Essential hypertension bp stable. Continue bp medication as prescribed .  General Counseling: Melody Moss verbalizes understanding of the findings of todays visit and agrees with plan of treatment. I have discussed any further diagnostic evaluation that may be needed or ordered today. We also reviewed her medications today. she has been encouraged to call the office with any questions or concerns that should arise related to todays visit.  Reviewed risks and possible side effects associated with taking opiates, benzodiazepines and other CNS depressants. Combination of these could cause dizziness and drowsiness. Advised patient not to drive or operate machinery when taking these medications, as patient's and other's life can be at risk and will have consequences. Patient verbalized understanding in this matter. Dependence and abuse for these drugs will be monitored closely. A Controlled substance policy and procedure is on file which allows [facility] to order a urine drug screen test at any visit. Patient understands and agrees with the plan  This patient was seen by Vincent Gros FNP Collaboration with Dr Lyndon Code as a part of collaborative care agreement  Orders Placed This Encounter  Procedures  . Ambulatory referral to Dermatology    Meds ordered this encounter  Medications  . tiZANidine (ZANAFLEX) 2 MG tablet    Sig: Take 1 tablet (2 mg total) by mouth every 8 (eight) hours as needed for muscle spasms.    Dispense:  30 tablet    Refill:  0    Order Specific Question:   Supervising Provider    Answer:   Lyndon Code [[id number]]  . traMADol (  ULTRAM) 50 MG tablet    Sig: Take 1 tablet (50 mg total) by mouth every 12 (twelve) hours as needed.    Dispense:  20 tablet    Refill:  0    Order Specific  Question:   Supervising Provider    Answer:   Lyndon Code [[id number]]  . diclofenac sodium (VOLTAREN) 1 % GEL    Sig: Apply 4 g topically 4 (four) times daily.    Dispense:  100 g    Refill:  3    Order Specific Question:   Supervising Provider    Answer:   Lyndon Code [[id number]]  . etodolac (LODINE) 400 MG tablet    Sig: Take 1 tablet (400 mg total) by mouth 2 (two) times daily as needed.    Dispense:  60 tablet    Refill:  1    Order Specific Question:   Supervising Provider    Answer:   Lyndon Code [[id number]]  . sulfamethoxazole-trimethoprim (BACTRIM DS,SEPTRA DS) 800-160 MG tablet    Sig: Take 1 tablet po BID for 3 weeks, then take  Tablet po QD    Dispense:  60 tablet    Refill:  1    D/c doxycycline for now.    Order Specific Question:   Supervising Provider    Answer:   Lyndon Code [[id number]]    Time spent: 44 Minutes      Dr Lyndon Code Internal medicine

## 2018-09-09 DIAGNOSIS — M791 Myalgia, unspecified site: Secondary | ICD-10-CM | POA: Insufficient documentation

## 2018-09-09 DIAGNOSIS — R0789 Other chest pain: Secondary | ICD-10-CM | POA: Insufficient documentation

## 2018-09-09 DIAGNOSIS — R071 Chest pain on breathing: Principal | ICD-10-CM

## 2018-09-10 ENCOUNTER — Telehealth: Payer: Self-pay | Admitting: Nurse Practitioner

## 2018-09-10 NOTE — Telephone Encounter (Signed)
tramodal is not being covered by medicaid at this time, try and fail anti inflammatory first.

## 2018-09-25 ENCOUNTER — Other Ambulatory Visit: Payer: Self-pay | Admitting: Nurse Practitioner

## 2018-09-25 DIAGNOSIS — K219 Gastro-esophageal reflux disease without esophagitis: Secondary | ICD-10-CM

## 2018-09-29 ENCOUNTER — Telehealth: Payer: Self-pay | Admitting: Nurse Practitioner

## 2018-09-29 NOTE — Telephone Encounter (Signed)
Prior auth for medication Etodolac has been approved good through 09/23/2019 [id number] Pharmacy notified

## 2018-10-09 ENCOUNTER — Telehealth: Payer: Self-pay | Admitting: Nurse Practitioner

## 2018-10-09 NOTE — Telephone Encounter (Signed)
Approval for one month override for diclofenac gel has been approved for one month , while voltaren gel is not available at this time, pharmacy notified

## 2018-11-04 ENCOUNTER — Other Ambulatory Visit: Payer: Self-pay

## 2018-11-04 DIAGNOSIS — K219 Gastro-esophageal reflux disease without esophagitis: Secondary | ICD-10-CM

## 2018-11-04 DIAGNOSIS — R0602 Shortness of breath: Secondary | ICD-10-CM

## 2018-11-04 MED ORDER — PANTOPRAZOLE SODIUM 40 MG PO TBEC: 40.0000 mg | DELAYED_RELEASE_TABLET | Freq: Every day | ORAL | 3 refills | Status: DC

## 2018-11-04 MED ORDER — PROAIR HFA 108 (90 BASE) MCG/ACT IN AERS
INHALATION_SPRAY | RESPIRATORY_TRACT | 3 refills | Status: DC
Start: 2018-11-04 — End: 2019-04-22

## 2018-12-08 ENCOUNTER — Ambulatory Visit: Payer: Medicaid Other | Admitting: Nurse Practitioner

## 2018-12-08 ENCOUNTER — Encounter: Payer: Self-pay | Admitting: Nurse Practitioner

## 2018-12-08 VITALS — BP 147/91 | HR 88 | Resp 16 | Ht 63.0 in | Wt 178.6 lb

## 2018-12-08 DIAGNOSIS — J4520 Mild intermittent asthma, uncomplicated: Secondary | ICD-10-CM | POA: Diagnosis not present

## 2018-12-08 DIAGNOSIS — F17210 Nicotine dependence, cigarettes, uncomplicated: Secondary | ICD-10-CM | POA: Diagnosis not present

## 2018-12-08 DIAGNOSIS — R0602 Shortness of breath: Secondary | ICD-10-CM | POA: Diagnosis not present

## 2018-12-08 DIAGNOSIS — IMO0001 Reserved for inherently not codable concepts without codable children: Secondary | ICD-10-CM

## 2018-12-08 DIAGNOSIS — I10 Essential (primary) hypertension: Secondary | ICD-10-CM

## 2018-12-08 DIAGNOSIS — E039 Hypothyroidism, unspecified: Secondary | ICD-10-CM | POA: Diagnosis not present

## 2018-12-08 MED ORDER — BUDESONIDE-FORMOTEROL FUMARATE 160-4.5 MCG/ACT IN AERO: 2.0000 | INHALATION_SPRAY | Freq: Two times a day (BID) | RESPIRATORY_TRACT | 12 refills | Status: DC

## 2018-12-08 NOTE — Progress Notes (Signed)
[facility] [address] [place], [place] [address]  Internal MEDICINE  Office Visit Note  Patient Name: Melody Moss  [id number]  [id number]  Date of Service: 12/16/2018  Chief Complaint  Patient presents with  . Medical Management of Chronic Issues    3 month follow up, pt is gaining weight and has not done anything different  . Breathing Problem  . Wheezing    pt states wheezing is getting worse at night, she is currently using an inhaler  . Dizziness    noticed dizziness when she gets up to walk, it feels like her legs doesnt want to listen to the brain, delayed    The patient is here for follow up visit. Today, she is c/o wheezing, especially at night. States she is using rescue inhaler a lot more due to the wheezing.  Continues to use symbicort twice daily. She continues to smoke, but is trying to curt down.  She is also reporting dizziness. This is most severe when changing position from lying down to sitting and from sitting to standing. States that dizziness last a few minutes then resolves on it's own. Does not lose consciousness. Denies presence of headache.  She has seen dermatology due to the lesion on the left side of her neck. She is scheduled to have this removed in the near future.        Current Medication: Outpatient Encounter Medications as of 12/08/2018  Medication Sig  . amLODipine (NORVASC) 5 MG tablet TAKE 1 TABLET(5 MG) BY MOUTH DAILY  . buPROPion (WELLBUTRIN XL) 150 MG 24 hr tablet TK 1 T PO QAM  . busPIRone (BUSPAR) 15 MG tablet take 1 tablet by mouth three times a day (DOSAGE INCREASE)  . carbamazepine (CARBATROL) 300 MG 12 hr capsule TK 2 [place] PO QHS  . carvedilol (COREG) 6.25 MG tablet TAKE 1 TABLET(6.25 MG) BY MOUTH TWICE DAILY WITH A MEAL  . clindamycin (CLEOCIN T) 1 % external solution 1 application.  . cloNIDine (CATAPRES) 0.1 MG tablet Take one tab po bid prn for elevated bp more than 160  . diclofenac sodium (VOLTAREN) 1 % GEL Apply 4  g topically 4 (four) times daily.  Marland Kitchen dicyclomine (BENTYL) 20 MG tablet Take by mouth.  . ergocalciferol (DRISDOL) 50000 units capsule Take 1 capsule (50,000 Units total) by mouth once a week.  . etodolac (LODINE) 400 MG tablet Take 1 tablet (400 mg total) by mouth 2 (two) times daily as needed.  Marland Kitchen LATUDA 60 MG TABS TK 1 T PO WITH DINNER  . levothyroxine (SYNTHROID, LEVOTHROID) 50 MCG tablet Take 1 tablet (50 mcg total) by mouth daily. On empty stomach  . NUEDEXTA 20-10 MG CAPS TK ONE C PO BID FOR PSEUDOBULBAR EFFECT  . pantoprazole (PROTONIX) 40 MG tablet Take 1 tablet (40 mg total) by mouth daily.  Marland Kitchen PROAIR HFA 108 (90 Base) MCG/ACT inhaler Inhale 2 puffs by mouth every 4-6 hours as needed for wheezing and cough.  Marland Kitchen spironolactone (ALDACTONE) 50 MG tablet Take 1 tablet (50 mg total) by mouth daily.  Marland Kitchen tiZANidine (ZANAFLEX) 2 MG tablet Take 1 tablet (2 mg total) by mouth every 8 (eight) hours as needed for muscle spasms.  . traMADol (ULTRAM) 50 MG tablet Take 1 tablet (50 mg total) by mouth every 12 (twelve) hours as needed.  . ziprasidone (GEODON) 20 MG capsule Take by mouth.  . [DISCONTINUED] budesonide-formoterol (SYMBICORT) 80-4.5 MCG/ACT inhaler Inhale 2 puffs into the lungs 2 (two) times daily.  Marland Kitchen  budesonide-formoterol (SYMBICORT) 160-4.5 MCG/ACT inhaler Inhale 2 puffs into the lungs 2 (two) times daily.  . [DISCONTINUED] sulfamethoxazole-trimethoprim (BACTRIM DS,SEPTRA DS) 800-160 MG tablet Take 1 tablet po BID for 3 weeks, then take  Tablet po QD (Patient not taking: Reported on 12/08/2018)   No facility-administered encounter medications on file as of 12/08/2018.     Surgical History: Past Surgical History:  Procedure Laterality Date  . COSMETIC SURGERY    . LAPAROSCOPIC OOPHORECTOMY     few  times  . TONSILLECTOMY Bilateral     Medical History: Past Medical History:  Diagnosis Date  . Bipolar 1 disorder (HCC)   . Depression   . Hypertension   . Panic attack     Family  History: Family History  Adopted: Yes    Social History   Socioeconomic History  . Marital status: Single    Spouse name: Not on file  . Number of children: Not on file  . Years of education: Not on file  . Highest education level: Not on file  Occupational History  . Not on file  Social Needs  . Financial resource strain: Not on file  . Food insecurity:    Worry: Not on file    Inability: Not on file  . Transportation needs:    Medical: Not on file    Non-medical: Not on file  Tobacco Use  . Smoking status: Current Every Day Smoker    Packs/day: 0.50    Types: Cigarettes  . Smokeless tobacco: Never Used  Substance and Sexual Activity  . Alcohol use: Yes  . Drug use: No  . Sexual activity: Not on file  Lifestyle  . Physical activity:    Days per week: Not on file    Minutes per session: Not on file  . Stress: Not on file  Relationships  . Social connections:    Talks on phone: Not on file    Gets together: Not on file    Attends religious service: Not on file    Active member of club or organization: Not on file    Attends meetings of clubs or organizations: Not on file    Relationship status: Not on file  . Intimate partner violence:    Fear of current or ex partner: Not on file    Emotionally abused: Not on file    Physically abused: Not on file    Forced sexual activity: Not on file  Other Topics Concern  . Not on file  Social History Narrative  . Not on file      Review of Systems  Constitutional: Positive for fatigue. Negative for activity change, chills and unexpected weight change.  HENT: Negative for congestion, postnasal drip, rhinorrhea, sneezing and sore throat.   Respiratory: Positive for shortness of breath and wheezing. Negative for cough and chest tightness.   Cardiovascular: Negative for chest pain and palpitations.  Gastrointestinal: Negative for abdominal distention, abdominal pain, constipation, diarrhea, nausea and vomiting.   Endocrine: Negative for cold intolerance, heat intolerance, polydipsia and polyuria.  Musculoskeletal: Positive for myalgias. Negative for arthralgias, back pain, joint swelling and neck pain.       Mostly along the right lowe rib cage and right posterior shoulder.  Skin: Negative for rash.       Single red, tender lesion on the left part of her neck. Has some clear drainage present as well as fluid which has dried. Scheduled to have this removed in the near future.   Allergic/Immunologic: Negative  for environmental allergies.  Neurological: Positive for dizziness. Negative for tremors, numbness and headaches.  Hematological: Negative for adenopathy. Does not bruise/bleed easily.  Psychiatric/Behavioral: Positive for behavioral problems (Depression). Negative for sleep disturbance and suicidal ideas. The patient is nervous/anxious.        Psychiatric disorder managed by mental health provider.     Today's Vitals   12/08/18 1628  BP: (!) 147/91  Pulse: 88  Resp: 16  SpO2: 96%  Weight: 178 lb 9.6 oz (81 kg)  Height: 5\' 3"  (1.6 m)   Body mass index is 31.64 kg/m.  Physical Exam Vitals signs and nursing note reviewed.  Constitutional:      General: She is not in acute distress.    Appearance: She is well-developed. She is not diaphoretic.  HENT:     Head: Normocephalic and atraumatic.     Nose: Nose normal.     Mouth/Throat:     Pharynx: No oropharyngeal exudate.  Eyes:     Conjunctiva/sclera: Conjunctivae normal.     Pupils: Pupils are equal, round, and reactive to light.  Neck:     Musculoskeletal: Normal range of motion and neck supple.     Thyroid: No thyromegaly.     Vascular: No carotid bruit or JVD.     Trachea: No tracheal deviation.  Cardiovascular:     Rate and Rhythm: Normal rate and regular rhythm.     Pulses: Normal pulses.     Heart sounds: Normal heart sounds. No murmur. No gallop.   Pulmonary:     Effort: Pulmonary effort is normal.     Breath sounds:  Wheezing present.  Abdominal:     General: Bowel sounds are normal. There is no distension.     Palpations: Abdomen is soft.     Tenderness: There is no abdominal tenderness.  Musculoskeletal: Normal range of motion.     Comments: There is moderate tenderness with palpation along the posterior, lower aspect of the right shoulder, stretches around the right lower rib cage. There is no bruising or swelling. No crepitus with palpation. Grimacing noted with coughing or sneezing. Also grimacing with twisting or bending at the waist.   Lymphadenopathy:     Cervical: No cervical adenopathy.  Skin:    General: Skin is warm and dry.     Comments: Single, red, round, lesion, on left anterior neck. pustular in nature. Some clear fluid present as well as some dried fluid.   Neurological:     Mental Status: She is alert and oriented to person, place, and time. Mental status is at baseline.     Cranial Nerves: No cranial nerve deficit.     Comments: Cranial nerves intact. Strength intact and equal bilaterally.   Psychiatric:        Mood and Affect: Mood is anxious.        Speech: Speech normal.        Behavior: Behavior normal.        Thought Content: Thought content normal.        Judgment: Judgment normal.   Assessment/Plan: 1. Moderate intermittent asthma without complication Worsening. Increase symbicort to 160/4.9mcg twice daily. Continue to use rescue inhaler as needed and as prescribed. Will get pulmonary function test and refer to Dr. Freda Munro for further evaluation and treatment.  - Pulmonary function test; Future - Ambulatory referral to Pulmonology - budesonide-formoterol (SYMBICORT) 160-4.5 MCG/ACT inhaler; Inhale 2 puffs into the lungs 2 (two) times daily.  Dispense: 1 Inhaler; Refill: 12  2. SOB (shortness of breath) Worsening. Increase symbicort to 160/4.285mcg twice daily. Continue to use rescue inhaler as needed and as prescribed. Will get pulmonary function test and refer to Dr.  Freda MunroSaadat Khan for further evaluation and treatment.   3. Hypothyroidism, unspecified type Check thyroid panel as abnormalities may be contributing to symptoms. Discuss results when available.   4. Essential hypertension Generally stable. Continue bp medication as prescribed.   5. Cigarette smoker  Discussed importance of smoking cessation with patient and the effects of smoking on asthma and COPD.  General Counseling: [place] verbalizes understanding of the findings of todays visit and agrees with plan of treatment. I have discussed any further diagnostic evaluation that may be needed or ordered today. We also reviewed her medications today. she has been encouraged to call the office with any questions or concerns that should arise related to todays visit.  Smoking cessation counseling: 1. Pt acknowledges the risks of long term smoking, she will try to quite smoking. 2. Options for different medications including nicotine products, chewing gum, patch etc, Wellbutrin and Chantix is discussed 3. Goal and date of compete cessation is discussed 4. Total time spent in smoking cessation is 10 min.  This patient was seen by Vincent GrosHeather Leona Alen FNP Collaboration with Dr Lyndon CodeFozia M Khan as a part of collaborative care agreement  Orders Placed This Encounter  Procedures  . Ambulatory referral to Pulmonology  . Pulmonary function test    Meds ordered this encounter  Medications  . budesonide-formoterol (SYMBICORT) 160-4.5 MCG/ACT inhaler    Sig: Inhale 2 puffs into the lungs 2 (two) times daily.    Dispense:  1 Inhaler    Refill:  12    Samples provided .    Order Specific Question:   Supervising Provider    Answer:   Lyndon CodeKHAN, FOZIA M [[id number]]    Time spent: 4325 Minutes      Dr Lyndon CodeFozia M Khan Internal medicine

## 2018-12-16 DIAGNOSIS — J4520 Mild intermittent asthma, uncomplicated: Secondary | ICD-10-CM | POA: Insufficient documentation

## 2018-12-16 DIAGNOSIS — IMO0001 Reserved for inherently not codable concepts without codable children: Secondary | ICD-10-CM | POA: Insufficient documentation

## 2018-12-16 DIAGNOSIS — F17210 Nicotine dependence, cigarettes, uncomplicated: Secondary | ICD-10-CM | POA: Insufficient documentation

## 2019-01-11 ENCOUNTER — Other Ambulatory Visit: Payer: Self-pay

## 2019-01-11 DIAGNOSIS — E039 Hypothyroidism, unspecified: Secondary | ICD-10-CM

## 2019-01-11 MED ORDER — LEVOTHYROXINE SODIUM 50 MCG PO TABS: 50.0000 ug | ORAL_TABLET | Freq: Every day | ORAL | 3 refills | Status: DC

## 2019-01-11 NOTE — Telephone Encounter (Signed)
Phar send pres refills for levothyroxine 25 mcg but I spoke with pt she is on levothyroxine and  Same in our record

## 2019-01-14 ENCOUNTER — Other Ambulatory Visit
Admission: RE | Admit: 2019-01-14 | Discharge: 2019-01-14 | Disposition: A | Discharge status: Home / Self Care | Payer: Medicaid Other | Source: Ambulatory Visit | Attending: Nurse Practitioner | Admitting: Nurse Practitioner

## 2019-01-14 DIAGNOSIS — E039 Hypothyroidism, unspecified: Secondary | ICD-10-CM | POA: Diagnosis present

## 2019-01-14 DIAGNOSIS — R635 Abnormal weight gain: Secondary | ICD-10-CM | POA: Insufficient documentation

## 2019-01-14 DIAGNOSIS — E538 Deficiency of other specified B group vitamins: Secondary | ICD-10-CM | POA: Diagnosis not present

## 2019-01-14 LAB — T4, FREE: Free T4: 0.82 ng/dL (ref 0.82–1.77)

## 2019-01-14 LAB — CBC
HCT: 41.4 % (ref 36.0–46.0)
Hemoglobin: 13.6 g/dL (ref 12.0–15.0)
MCH: 31.3 pg (ref 26.0–34.0)
MCHC: 32.9 g/dL (ref 30.0–36.0)
MCV: 95.4 fL (ref 80.0–100.0)
Platelets: 241 10*3/uL (ref 150–400)
RBC: 4.34 MIL/uL (ref 3.87–5.11)
RDW: 12.4 % (ref 11.5–15.5)
WBC: 6.3 10*3/uL (ref 4.0–10.5)
nRBC: 0 % (ref 0.0–0.2)

## 2019-01-14 LAB — VITAMIN B12: Vitamin B-12: 261 pg/mL (ref 180–914)

## 2019-01-14 LAB — FERRITIN: Ferritin: 67 ng/mL (ref 11–307)

## 2019-01-14 LAB — TSH: TSH: 2.971 u[IU]/mL (ref 0.350–4.500)

## 2019-02-03 ENCOUNTER — Ambulatory Visit: Payer: Self-pay | Admitting: Internal Medicine

## 2019-02-09 ENCOUNTER — Other Ambulatory Visit: Payer: Self-pay | Admitting: Nurse Practitioner

## 2019-02-12 ENCOUNTER — Other Ambulatory Visit: Payer: Self-pay

## 2019-02-12 DIAGNOSIS — I10 Essential (primary) hypertension: Secondary | ICD-10-CM

## 2019-02-12 MED ORDER — CARVEDILOL 6.25 MG PO TABS: ORAL_TABLET | ORAL | 1 refills | Status: DC

## 2019-03-24 ENCOUNTER — Ambulatory Visit: Payer: Self-pay | Admitting: Internal Medicine

## 2019-04-08 ENCOUNTER — Other Ambulatory Visit: Payer: Self-pay

## 2019-04-08 DIAGNOSIS — K219 Gastro-esophageal reflux disease without esophagitis: Secondary | ICD-10-CM

## 2019-04-08 MED ORDER — PANTOPRAZOLE SODIUM 40 MG PO TBEC: 40.0000 mg | DELAYED_RELEASE_TABLET | Freq: Every day | ORAL | 3 refills | Status: DC

## 2019-04-22 ENCOUNTER — Other Ambulatory Visit: Payer: Self-pay

## 2019-04-22 DIAGNOSIS — IMO0001 Reserved for inherently not codable concepts without codable children: Secondary | ICD-10-CM

## 2019-04-22 DIAGNOSIS — R0602 Shortness of breath: Secondary | ICD-10-CM

## 2019-04-22 DIAGNOSIS — J4520 Mild intermittent asthma, uncomplicated: Secondary | ICD-10-CM

## 2019-04-22 MED ORDER — BUDESONIDE-FORMOTEROL FUMARATE 160-4.5 MCG/ACT IN AERO: 2.0000 | INHALATION_SPRAY | Freq: Two times a day (BID) | RESPIRATORY_TRACT | 12 refills | Status: DC

## 2019-04-22 MED ORDER — PROAIR HFA 108 (90 BASE) MCG/ACT IN AERS: INHALATION_SPRAY | RESPIRATORY_TRACT | 1 refills | Status: DC

## 2019-05-03 ENCOUNTER — Ambulatory Visit (INDEPENDENT_AMBULATORY_CARE_PROVIDER_SITE_OTHER): Payer: Medicaid Other | Admitting: Nurse Practitioner

## 2019-05-03 ENCOUNTER — Encounter: Payer: Self-pay | Admitting: Nurse Practitioner

## 2019-05-03 ENCOUNTER — Other Ambulatory Visit: Payer: Self-pay

## 2019-05-03 VITALS — Ht 63.0 in | Wt 170.0 lb

## 2019-05-03 DIAGNOSIS — J4520 Mild intermittent asthma, uncomplicated: Secondary | ICD-10-CM

## 2019-05-03 DIAGNOSIS — I10 Essential (primary) hypertension: Secondary | ICD-10-CM

## 2019-05-03 DIAGNOSIS — R635 Abnormal weight gain: Secondary | ICD-10-CM

## 2019-05-03 DIAGNOSIS — R071 Chest pain on breathing: Secondary | ICD-10-CM | POA: Diagnosis not present

## 2019-05-03 DIAGNOSIS — IMO0001 Reserved for inherently not codable concepts without codable children: Secondary | ICD-10-CM

## 2019-05-03 DIAGNOSIS — E039 Hypothyroidism, unspecified: Secondary | ICD-10-CM | POA: Diagnosis not present

## 2019-05-03 DIAGNOSIS — R0789 Other chest pain: Secondary | ICD-10-CM

## 2019-05-03 NOTE — Progress Notes (Signed)
[facility] [address] [place], [place] [address]  Internal MEDICINE  Telephone Visit  Patient Name: Melody CalicoRina Moss  [id number] 30, 2062  [id number]  Date of Service: 05/05/2019  I connected with the patient at 4:33pm by telephone and verified the patients identity using two identifiers.   I discussed the limitations, risks, security and privacy concerns of performing an evaluation and management service by telephone and the availability of in person appointments. I also discussed with the patient that there may be a patient responsible charge related to the service.  The patient expressed understanding and agrees to proceed.    Chief Complaint  Patient presents with  . Telephone Assessment  . Telephone Screen  . Anxiety  . Depression  . Quality Metric Gaps    pap and colonoscopy     .The patient has been contacted via telephone for follow up visit due to concerns for spread of novel coronavirus. She is complaining of right sided abdominal/back pain. This has been happening intermittently over past few years. Starts just under the right rib cage and stretches around to the right upper back. Is gradually subsiding. She states that pain was so bad that she needed assistance to get out of bed. She is upset that she continues to gain weight. Thyroid checked after her last visit. It was normal. She is on several medications for mental health. Discussed that weight gain is associated with ome of the medications she is taking, especially latuda.       Current Medication: Outpatient Encounter Medications as of 05/03/2019  Medication Sig  . amLODipine (NORVASC) 5 MG tablet TAKE 1 TABLET(5 MG) BY MOUTH DAILY  . budesonide-formoterol (SYMBICORT) 160-4.5 MCG/ACT inhaler Inhale 2 puffs into the lungs 2 (two) times daily.  Marland Kitchen. buPROPion (WELLBUTRIN XL) 150 MG 24 hr tablet TK 1 T PO QAM  . busPIRone (BUSPAR) 15 MG tablet take 1 tablet by mouth three times a day (DOSAGE INCREASE)  . carbamazepine  (CARBATROL) 300 MG 12 hr capsule TK 2  PO QHS  . carvedilol (COREG) 6.25 MG tablet TAKE 1 TABLET(6.25 MG) BY MOUTH TWICE DAILY WITH A MEAL  . clindamycin (CLEOCIN T) 1 % external solution 1 application.  . cloNIDine (CATAPRES) 0.1 MG tablet Take one tab po bid prn for elevated bp more than 160  . diclofenac sodium (VOLTAREN) 1 % GEL Apply 4 g topically 4 (four) times daily.  Marland Kitchen. dicyclomine (BENTYL) 20 MG tablet Take by mouth.  . ergocalciferol (DRISDOL) 50000 units capsule Take 1 capsule (50,000 Units total) by mouth once a week.  . etodolac (LODINE) 400 MG tablet Take 1 tablet (400 mg total) by mouth 2 (two) times daily as needed.  Marland Kitchen. LATUDA 60 MG TABS TK 1 T PO WITH DINNER  . levothyroxine (SYNTHROID, LEVOTHROID) 50 MCG tablet Take 1 tablet (50 mcg total) by mouth daily. On empty stomach  . NUEDEXTA 20-10 MG CAPS TK ONE C PO BID FOR PSEUDOBULBAR EFFECT  . pantoprazole (PROTONIX) 40 MG tablet Take 1 tablet (40 mg total) by mouth daily.  Marland Kitchen. PROAIR HFA 108 (90 Base) MCG/ACT inhaler Inhale 2 puffs by mouth every 4-6 hours as needed for wheezing and cough.  Marland Kitchen. spironolactone (ALDACTONE) 50 MG tablet Take 1 tablet (50 mg total) by mouth daily.  Marland Kitchen. tiZANidine (ZANAFLEX) 2 MG tablet Take 1 tablet (2 mg total) by mouth every 8 (eight) hours as needed for muscle spasms.  . traMADol (ULTRAM) 50 MG tablet Take 1 tablet (50 mg total) by  mouth every 12 (twelve) hours as needed.  . ziprasidone (GEODON) 20 MG capsule Take by mouth.   No facility-administered encounter medications on file as of 05/03/2019.     Surgical History: Past Surgical History:  Procedure Laterality Date  . COSMETIC SURGERY    . LAPAROSCOPIC OOPHORECTOMY     few  times  . TONSILLECTOMY Bilateral     Medical History: Past Medical History:  Diagnosis Date  . Bipolar 1 disorder (HCC)   . Depression   . Hypertension   . Panic attack     Family History: Family History  Adopted: Yes    Social History   Socioeconomic  History  . Marital status: Single    Spouse name: Not on file  . Number of children: Not on file  . Years of education: Not on file  . Highest education level: Not on file  Occupational History  . Not on file  Social Needs  . Financial resource strain: Not on file  . Food insecurity    Worry: Not on file    Inability: Not on file  . Transportation needs    Medical: Not on file    Non-medical: Not on file  Tobacco Use  . Smoking status: Current Every Day Smoker    Packs/day: 0.50    Types: Cigarettes  . Smokeless tobacco: Never Used  Substance and Sexual Activity  . Alcohol use: Yes  . Drug use: No  . Sexual activity: Not on file  Lifestyle  . Physical activity    Days per week: Not on file    Minutes per session: Not on file  . Stress: Not on file  Relationships  . Social Musicianconnections    Talks on phone: Not on file    Gets together: Not on file    Attends religious service: Not on file    Active member of club or organization: Not on file    Attends meetings of clubs or organizations: Not on file    Relationship status: Not on file  . Intimate partner violence    Fear of current or ex partner: Not on file    Emotionally abused: Not on file    Physically abused: Not on file    Forced sexual activity: Not on file  Other Topics Concern  . Not on file  Social History Narrative  . Not on file      Review of Systems  Constitutional: Positive for fatigue and unexpected weight change. Negative for activity change and chills.       Continues to gain weight.   HENT: Negative for congestion, postnasal drip, rhinorrhea, sneezing and sore throat.   Respiratory: Positive for shortness of breath and wheezing. Negative for cough and chest tightness.   Cardiovascular: Negative for chest pain and palpitations.  Gastrointestinal: Negative for abdominal distention, abdominal pain, constipation, diarrhea, nausea and vomiting.  Endocrine: Negative for cold intolerance, heat  intolerance, polydipsia and polyuria.       Thyroid panel normal.   Musculoskeletal: Positive for myalgias. Negative for arthralgias, back pain, joint swelling and neck pain.       Mostly along the right lowe rib cage and right posterior shoulder.  Skin: Negative for rash.  Allergic/Immunologic: Negative for environmental allergies.  Neurological: Positive for dizziness. Negative for tremors, numbness and headaches.  Hematological: Negative for adenopathy. Does not bruise/bleed easily.  Psychiatric/Behavioral: Positive for behavioral problems (Depression). Negative for sleep disturbance and suicidal ideas. The patient is nervous/anxious.  Psychiatric disorder managed by mental health provider.     Today's Vitals   05/03/19 1557  Weight: 170 lb (77.1 kg)  Height: 5\' 3"  (1.6 m)   Body mass index is 30.11 kg/m.   Observation/Objective:   The patient is alert and oriented. She is pleasant and answers all questions appropriately. Breathing is non-labored. She is in no acute distress at this time.    Assessment/Plan: 1. Costochondral pain Intermittent. Prior chest x-rays and imaging have been negative. Consider further imaging if problem is persistent.   2. Abnormal weight gain Suspect related to medication such as Latuda. Advised patient to discuss this with her psychiatrist at her next visit.   3. Acquired hypothyroidism Thyroid stable. Continue to monitor   4. Moderate intermittent asthma without complication Continue inhalers as prescribed   5. Essential hypertension Stable. Continue bp medication as prescribed   General Counseling: Lillith verbalizes understanding of the findings of today's phone visit and agrees with plan of treatment. I have discussed any further diagnostic evaluation that may be needed or ordered today. We also reviewed her medications today. she has been encouraged to call the office with any questions or concerns that should arise related to todays  visit.   Hypertension Counseling:   The following hypertensive lifestyle modification were recommended and discussed:  1. Limiting alcohol intake to less than 1 oz/day of ethanol:(24 oz of beer or 8 oz of wine or 2 oz of 100-proof whiskey). 2. Take baby ASA 81 mg daily. 3. Importance of regular aerobic exercise and losing weight. 4. Reduce dietary saturated fat and cholesterol intake for overall cardiovascular health. 5. Maintaining adequate dietary potassium, calcium, and magnesium intake. 6. Regular monitoring of the blood pressure. 7. Reduce sodium intake to less than 100 mmol/day (less than 2.3 gm of sodium or less than 6 gm of sodium choride)   This patient was seen by [place] with Dr Lavera Guise as a part of collaborative care agreement   Time spent: 25 Minutes    Dr Lavera Guise Internal medicine

## 2019-05-05 DIAGNOSIS — R635 Abnormal weight gain: Secondary | ICD-10-CM | POA: Insufficient documentation

## 2019-06-10 ENCOUNTER — Other Ambulatory Visit: Payer: Self-pay

## 2019-06-10 DIAGNOSIS — I10 Essential (primary) hypertension: Secondary | ICD-10-CM

## 2019-06-10 MED ORDER — AMLODIPINE BESYLATE 5 MG PO TABS: ORAL_TABLET | ORAL | 2 refills | Status: DC

## 2019-07-20 ENCOUNTER — Encounter: Payer: Medicaid Other | Admitting: Nurse Practitioner

## 2019-08-27 ENCOUNTER — Other Ambulatory Visit: Payer: Self-pay

## 2019-08-27 ENCOUNTER — Other Ambulatory Visit: Payer: Medicaid Other | Admitting: Nurse Practitioner

## 2019-08-27 DIAGNOSIS — K219 Gastro-esophageal reflux disease without esophagitis: Secondary | ICD-10-CM

## 2019-08-27 MED ORDER — PANTOPRAZOLE SODIUM 40 MG PO TBEC: 40.0000 mg | DELAYED_RELEASE_TABLET | Freq: Every day | ORAL | 3 refills | Status: DC

## 2019-09-03 ENCOUNTER — Other Ambulatory Visit: Payer: Self-pay

## 2019-09-03 DIAGNOSIS — I10 Essential (primary) hypertension: Secondary | ICD-10-CM

## 2019-09-03 MED ORDER — CARVEDILOL 6.25 MG PO TABS: ORAL_TABLET | ORAL | 1 refills | Status: DC

## 2019-10-27 ENCOUNTER — Telehealth: Payer: Self-pay

## 2019-10-27 NOTE — Telephone Encounter (Signed)
CONFIRMED AND SCREENED FOR 10-29-19 OV. °

## 2019-10-29 ENCOUNTER — Encounter: Payer: Self-pay | Admitting: Nurse Practitioner

## 2019-10-29 ENCOUNTER — Encounter (INDEPENDENT_AMBULATORY_CARE_PROVIDER_SITE_OTHER): Payer: Self-pay

## 2019-10-29 ENCOUNTER — Other Ambulatory Visit: Payer: Self-pay

## 2019-10-29 ENCOUNTER — Ambulatory Visit: Payer: Medicaid Other | Admitting: Nurse Practitioner

## 2019-10-29 VITALS — BP 149/102 | HR 88 | Temp 97.9°F | Resp 16 | Ht 63.0 in | Wt 168.4 lb

## 2019-10-29 DIAGNOSIS — Z124 Encounter for screening for malignant neoplasm of cervix: Secondary | ICD-10-CM | POA: Diagnosis not present

## 2019-10-29 DIAGNOSIS — Z0001 Encounter for general adult medical examination with abnormal findings: Secondary | ICD-10-CM

## 2019-10-29 DIAGNOSIS — R0789 Other chest pain: Secondary | ICD-10-CM

## 2019-10-29 DIAGNOSIS — R0602 Shortness of breath: Secondary | ICD-10-CM | POA: Diagnosis not present

## 2019-10-29 DIAGNOSIS — E039 Hypothyroidism, unspecified: Secondary | ICD-10-CM

## 2019-10-29 DIAGNOSIS — R300 Dysuria: Secondary | ICD-10-CM

## 2019-10-29 DIAGNOSIS — I10 Essential (primary) hypertension: Secondary | ICD-10-CM

## 2019-10-29 DIAGNOSIS — M064 Inflammatory polyarthropathy: Secondary | ICD-10-CM

## 2019-10-29 DIAGNOSIS — F17210 Nicotine dependence, cigarettes, uncomplicated: Secondary | ICD-10-CM

## 2019-10-29 MED ORDER — PROAIR HFA 108 (90 BASE) MCG/ACT IN AERS: INHALATION_SPRAY | RESPIRATORY_TRACT | 3 refills | Status: DC

## 2019-10-29 MED ORDER — DICLOFENAC SODIUM 1 % EX GEL: 4.0000 g | Freq: Four times a day (QID) | CUTANEOUS | 3 refills | Status: DC

## 2019-10-29 NOTE — Progress Notes (Signed)
[facility] [place], [place] [address]  Internal MEDICINE  Office Visit Note  Patient Name: Melody Moss  [id number]  [id number]  Date of Service: 10/31/2019  Chief Complaint  Patient presents with  . Annual Exam  . Hypertension    feels like head is on fire   . Quality Metric Gaps    colonoscopy  . Neck Pain  . Shoulder Pain  . Back Pain     The patient is here for health maintenance exam and pap smear. She is complaining of right sided abdominal/back pain. This has been happening intermittently over past few years. Starts just under the right rib cage and stretches around to the right upper back.  She states that pain was so bad that pain can get so severe, she needs assistance just to get out of bed. She is upset that she continues to gain weight. Thyroid is due to be checked.  She is on several medications for mental health. Discussed that weight gain is associated with ome of the medications she is taking, especially latuda.    Pt is here for routine health maintenance examination  Current Medication: Outpatient Encounter Medications as of 10/29/2019  Medication Sig  . amLODipine (NORVASC) 5 MG tablet TAKE 1 TABLET(5 MG) BY MOUTH DAILY  . budesonide-formoterol (SYMBICORT) 160-4.5 MCG/ACT inhaler Inhale 2 puffs into the lungs 2 (two) times daily.  Marland Kitchen buPROPion (WELLBUTRIN XL) 150 MG 24 hr tablet TK 1 T PO QAM  . busPIRone (BUSPAR) 15 MG tablet take 1 tablet by mouth three times a day (DOSAGE INCREASE)  . carbamazepine (CARBATROL) 300 MG 12 hr capsule TK 2 Mazon PO QHS  . carvedilol (COREG) 6.25 MG tablet TAKE 1 TABLET(6.25 MG) BY MOUTH TWICE DAILY WITH A MEAL  . clindamycin (CLEOCIN T) 1 % external solution 1 application.  . cloNIDine (CATAPRES) 0.1 MG tablet Take one tab po bid prn for elevated bp more than 160  . dicyclomine (BENTYL) 20 MG tablet Take by mouth.  . ergocalciferol (DRISDOL) 50000 units capsule Take 1 capsule (50,000 Units total) by  mouth once a week.  . etodolac (LODINE) 400 MG tablet Take 1 tablet (400 mg total) by mouth 2 (two) times daily as needed.  Marland Kitchen LATUDA 60 MG TABS TK 1 T PO WITH DINNER  . levothyroxine (SYNTHROID, LEVOTHROID) 50 MCG tablet Take 1 tablet (50 mcg total) by mouth daily. On empty stomach  . NUEDEXTA 20-10 MG CAPS TK ONE C PO BID FOR PSEUDOBULBAR EFFECT  . pantoprazole (PROTONIX) 40 MG tablet Take 1 tablet (40 mg total) by mouth daily.  Marland Kitchen PROAIR HFA 108 (90 Base) MCG/ACT inhaler Inhale 2 puffs by mouth every 4-6 hours as needed for wheezing and cough.  Marland Kitchen spironolactone (ALDACTONE) 50 MG tablet Take 1 tablet (50 mg total) by mouth daily.  Marland Kitchen tiZANidine (ZANAFLEX) 2 MG tablet Take 1 tablet (2 mg total) by mouth every 8 (eight) hours as needed for muscle spasms.  . traMADol (ULTRAM) 50 MG tablet Take 1 tablet (50 mg total) by mouth every 12 (twelve) hours as needed.  . ziprasidone (GEODON) 20 MG capsule Take by mouth.  . [DISCONTINUED] diclofenac sodium (VOLTAREN) 1 % GEL Apply 4 g topically 4 (four) times daily.  . [DISCONTINUED] PROAIR HFA 108 (90 Base) MCG/ACT inhaler Inhale 2 puffs by mouth every 4-6 hours as needed for wheezing and cough.  . diclofenac Sodium (VOLTAREN) 1 % GEL Apply 4 g topically 4 (four) times daily.  No facility-administered encounter medications on file as of 10/29/2019.    Surgical History: Past Surgical History:  Procedure Laterality Date  . COSMETIC SURGERY    . LAPAROSCOPIC OOPHORECTOMY     few  times  . TONSILLECTOMY Bilateral     Medical History: Past Medical History:  Diagnosis Date  . Bipolar 1 disorder (HCC)   . Depression   . Hypertension   . Panic attack     Family History: Family History  Adopted: Yes      Review of Systems  Constitutional: Positive for fatigue and unexpected weight change. Negative for activity change and chills.       Continues to gain weight.   HENT: Negative for congestion, postnasal drip, rhinorrhea, sneezing and sore  throat.   Respiratory: Positive for shortness of breath and wheezing. Negative for cough and chest tightness.        Intermittent.   Cardiovascular: Negative for chest pain and palpitations.  Gastrointestinal: Negative for abdominal distention, abdominal pain, constipation, diarrhea, nausea and vomiting.  Endocrine: Negative for cold intolerance, heat intolerance, polydipsia and polyuria.       Due to have thyroid panel checked.   Genitourinary: Negative for dysuria, flank pain, frequency, hematuria and urgency.  Musculoskeletal: Positive for myalgias. Negative for arthralgias, back pain, joint swelling and neck pain.       Mostly along the right lowe rib cage and right posterior shoulder.  Skin: Negative for rash.  Allergic/Immunologic: Negative for environmental allergies.  Neurological: Positive for dizziness. Negative for tremors, numbness and headaches.  Hematological: Negative for adenopathy. Does not bruise/bleed easily.  Psychiatric/Behavioral: Positive for behavioral problems (Depression). Negative for sleep disturbance and suicidal ideas. The patient is nervous/anxious.        Psychiatric disorder managed by mental health provider.      Today's Vitals   10/29/19 1452  BP: (!) 149/102  Pulse: 88  Resp: 16  Temp: 97.9 F (36.6 C)  SpO2: 98%  Weight: 168 lb 6.4 oz (76.4 kg)  Height: 5\' 3"  (1.6 m)   Body mass index is 29.83 kg/m.  Physical Exam Vitals and nursing note reviewed.  Constitutional:      General: She is not in acute distress.    Appearance: Normal appearance. She is well-developed. She is not diaphoretic.  HENT:     Head: Normocephalic and atraumatic.     Nose: Nose normal.     Mouth/Throat:     Mouth: Mucous membranes are moist.     Pharynx: No oropharyngeal exudate.  Eyes:     Pupils: Pupils are equal, round, and reactive to light.  Neck:     Thyroid: No thyromegaly.     Vascular: No carotid bruit or JVD.     Trachea: No tracheal deviation.   Cardiovascular:     Rate and Rhythm: Normal rate and regular rhythm.     Pulses: Normal pulses.     Heart sounds: Normal heart sounds. No murmur. No friction rub. No gallop.   Pulmonary:     Effort: Pulmonary effort is normal. No respiratory distress.     Breath sounds: Normal breath sounds. No wheezing or rales.  Chest:     Chest wall: No tenderness.     Breasts:        Right: Normal. No swelling, bleeding, inverted nipple, mass, nipple discharge, skin change or tenderness.        Left: Normal. No swelling, bleeding, inverted nipple, mass, nipple discharge, skin change or tenderness.  Abdominal:  General: Bowel sounds are normal.     Palpations: Abdomen is soft.     Tenderness: There is no abdominal tenderness.     Hernia: There is no hernia in the left inguinal area or right inguinal area.  Genitourinary:    General: Normal vulva.     Exam position: Supine.     Labia:        Right: No rash or tenderness.        Left: No rash or tenderness.      Vagina: Normal. No vaginal discharge, erythema or lesions.     Cervix: No discharge, friability, lesion or erythema.     Uterus: Normal.      Adnexa: Right adnexa normal and left adnexa normal.     Comments: No tenderness, masses, or organomeglay present during bimanual exam . Musculoskeletal:        General: Normal range of motion.     Cervical back: Normal range of motion and neck supple.  Lymphadenopathy:     Cervical: No cervical adenopathy.     Upper Body:     Right upper body: No axillary adenopathy.     Left upper body: No axillary adenopathy.     Lower Body: No right inguinal adenopathy. No left inguinal adenopathy.  Skin:    General: Skin is warm and dry.  Neurological:     General: No focal deficit present.     Mental Status: She is alert and oriented to person, place, and time.     Cranial Nerves: No cranial nerve deficit.  Psychiatric:        Attention and Perception: Attention and perception normal.        Mood  and Affect: Affect normal. Mood is anxious and depressed.        Speech: Speech normal.        Behavior: Behavior normal. Behavior is cooperative.        Thought Content: Thought content normal.        Cognition and Memory: Cognition and memory normal.        Judgment: Judgment normal.      LABS: Recent Results (from the past 2160 hour(s))  UA/M w/rflx Culture, Routine     Status: None   Collection Time: 10/29/19  2:54 PM   Specimen: Urine   URINE  Result Value Ref Range   Specific Gravity, UA 1.015 1.005 - 1.030   pH, UA 5.5 5.0 - 7.5   Color, UA Yellow Yellow   Appearance Ur Clear Clear   Leukocytes,UA Negative Negative   Protein,UA Negative Negative/Trace   Glucose, UA Negative Negative   Ketones, UA Negative Negative   RBC, UA Negative Negative   Bilirubin, UA Negative Negative   Urobilinogen, Ur 0.2 0.2 - 1.0 mg/dL   Nitrite, UA Negative Negative   Microscopic Examination Comment     Comment: Microscopic follows if indicated.   Microscopic Examination See below:     Comment: Microscopic was indicated and was performed.   Urinalysis Reflex Comment     Comment: This specimen will not reflex to a Urine Culture.  Microscopic Examination     Status: None   Collection Time: 10/29/19  2:54 PM   URINE  Result Value Ref Range   WBC, UA 0-5 0 - 5 /hpf   RBC 0-2 0 - 2 /hpf   Epithelial Cells (non renal) 0-10 0 - 10 /hpf   Casts None seen None seen /lpf   Mucus, UA Present Not Estab.  Bacteria, UA Few None seen/Few    Assessment/Plan: 1. Encounter for health maintenance examination with abnormal findings Annual health maintenance exam and pap smear today.   2. SOB (shortness of breath) Patient should continue symbicort twice daily and use proair inhaler as needed and as prescribed.  - PROAIR HFA 108 (90 Base) MCG/ACT inhaler; Inhale 2 puffs by mouth every 4-6 hours as needed for wheezing and cough.  Dispense: 18 g; Refill: 3  3. Costochondral pain Will get chest  x-ray for further evaluation.  - chest x-ray; Future  4. Hypothyroidism, unspecified type Check thyroid panel and adjust levothyroxine as indicated.   5. Essential hypertension, malignant Generally stable. Continue bp medication as prescribed   6. Inflammatory polyarthritis (HCC) New prescription for voltaren gel. Apply to affected areas up to four times daily as needed.  - diclofenac Sodium (VOLTAREN) 1 % GEL; Apply 4 g topically 4 (four) times daily.  Dispense: 100 g; Refill: 3  7. Routine cervical smear - Pap IG and HPV (high risk) DNA detection  8. Cigarette smoker Chest x-ray ordered for further evaluation.  - chest x-ray; Future  9. Dysuria - UA/M w/rflx Culture, Routine  General Counseling: Tiona verbalizes understanding of the findings of todays visit and agrees with plan of treatment. I have discussed any further diagnostic evaluation that may be needed or ordered today. We also reviewed her medications today. she has been encouraged to call the office with any questions or concerns that should arise related to todays visit.    Counseling:  This patient was seen by Vincent Gros FNP Collaboration with Dr Lyndon Code as a part of collaborative care agreement  Orders Placed This Encounter  Procedures  . Microscopic Examination  . chest x-ray  . UA/M w/rflx Culture, Routine    Meds ordered this encounter  Medications  . diclofenac Sodium (VOLTAREN) 1 % GEL    Sig: Apply 4 g topically 4 (four) times daily.    Dispense:  100 g    Refill:  3    Order Specific Question:   Supervising Provider    Answer:   Lyndon Code [[id number]]  . PROAIR HFA 108 (90 Base) MCG/ACT inhaler    Sig: Inhale 2 puffs by mouth every 4-6 hours as needed for wheezing and cough.    Dispense:  18 g    Refill:  3    Order Specific Question:   Supervising Provider    Answer:   Lyndon Code [[id number]]    Time spent: 48 Minutes      Lyndon Code, MD  Internal Medicine

## 2019-10-30 LAB — UA/M W/RFLX CULTURE, ROUTINE
Bilirubin, UA: NEGATIVE
Glucose, UA: NEGATIVE
Ketones, UA: NEGATIVE
Leukocytes,UA: NEGATIVE
Nitrite, UA: NEGATIVE
Protein,UA: NEGATIVE
RBC, UA: NEGATIVE
Specific Gravity, UA: 1.015 (ref 1.005–1.030)
Urobilinogen, Ur: 0.2 mg/dL (ref 0.2–1.0)
pH, UA: 5.5 (ref 5.0–7.5)

## 2019-10-30 LAB — MICROSCOPIC EXAMINATION: Casts: NONE SEEN /lpf

## 2019-10-31 ENCOUNTER — Encounter: Payer: Self-pay | Admitting: Nurse Practitioner

## 2019-10-31 DIAGNOSIS — Z124 Encounter for screening for malignant neoplasm of cervix: Secondary | ICD-10-CM | POA: Insufficient documentation

## 2019-10-31 DIAGNOSIS — M064 Inflammatory polyarthropathy: Secondary | ICD-10-CM | POA: Insufficient documentation

## 2019-10-31 DIAGNOSIS — R300 Dysuria: Secondary | ICD-10-CM | POA: Insufficient documentation

## 2019-11-04 LAB — PAP IG AND HPV HIGH-RISK: HPV, high-risk: NEGATIVE

## 2019-11-04 NOTE — Progress Notes (Signed)
Please let the patient know that her pap smear is normal. Thanks.

## 2019-12-13 ENCOUNTER — Telehealth: Payer: Self-pay

## 2019-12-13 NOTE — Telephone Encounter (Signed)
Patient rescheduled appointment on 12/17/2019 to 03/05/202. klh

## 2019-12-17 ENCOUNTER — Ambulatory Visit: Payer: Medicaid Other | Admitting: Nurse Practitioner

## 2019-12-30 ENCOUNTER — Other Ambulatory Visit: Payer: Self-pay

## 2019-12-30 DIAGNOSIS — R0602 Shortness of breath: Secondary | ICD-10-CM

## 2019-12-30 MED ORDER — PROAIR HFA 108 (90 BASE) MCG/ACT IN AERS: INHALATION_SPRAY | RESPIRATORY_TRACT | 3 refills | Status: DC

## 2020-01-03 ENCOUNTER — Other Ambulatory Visit: Payer: Self-pay

## 2020-01-03 DIAGNOSIS — E039 Hypothyroidism, unspecified: Secondary | ICD-10-CM

## 2020-01-03 MED ORDER — LEVOTHYROXINE SODIUM 50 MCG PO TABS: 50.0000 ug | ORAL_TABLET | Freq: Every day | ORAL | 3 refills | Status: DC

## 2020-01-04 ENCOUNTER — Other Ambulatory Visit: Payer: Self-pay

## 2020-01-04 DIAGNOSIS — K219 Gastro-esophageal reflux disease without esophagitis: Secondary | ICD-10-CM

## 2020-01-04 MED ORDER — PANTOPRAZOLE SODIUM 40 MG PO TBEC: 40.0000 mg | DELAYED_RELEASE_TABLET | Freq: Every day | ORAL | 3 refills | Status: DC

## 2020-01-21 ENCOUNTER — Ambulatory Visit: Payer: Medicaid Other | Admitting: Nurse Practitioner

## 2020-02-29 ENCOUNTER — Ambulatory Visit: Payer: Medicaid Other | Admitting: Nurse Practitioner

## 2020-03-01 ENCOUNTER — Other Ambulatory Visit: Payer: Self-pay

## 2020-03-01 DIAGNOSIS — I10 Essential (primary) hypertension: Secondary | ICD-10-CM

## 2020-03-01 MED ORDER — AMLODIPINE BESYLATE 5 MG PO TABS: ORAL_TABLET | ORAL | 2 refills | Status: DC

## 2020-03-01 MED ORDER — CARVEDILOL 6.25 MG PO TABS: ORAL_TABLET | ORAL | 1 refills | Status: DC

## 2020-04-04 ENCOUNTER — Ambulatory Visit: Payer: Medicaid Other | Admitting: Nurse Practitioner

## 2020-04-20 ENCOUNTER — Telehealth: Payer: Self-pay

## 2020-04-20 NOTE — Telephone Encounter (Signed)
Lmom to confirm and screen for 04-24-20 ov. 

## 2020-04-24 ENCOUNTER — Ambulatory Visit: Payer: Medicaid Other | Admitting: Nurse Practitioner

## 2020-05-01 ENCOUNTER — Other Ambulatory Visit: Payer: Self-pay

## 2020-05-01 DIAGNOSIS — K219 Gastro-esophageal reflux disease without esophagitis: Secondary | ICD-10-CM

## 2020-05-01 MED ORDER — PANTOPRAZOLE SODIUM 40 MG PO TBEC: 40.0000 mg | DELAYED_RELEASE_TABLET | Freq: Every day | ORAL | 3 refills | Status: DC

## 2020-05-04 ENCOUNTER — Other Ambulatory Visit: Payer: Self-pay

## 2020-05-04 DIAGNOSIS — R0602 Shortness of breath: Secondary | ICD-10-CM

## 2020-05-04 MED ORDER — PROAIR HFA 108 (90 BASE) MCG/ACT IN AERS: INHALATION_SPRAY | RESPIRATORY_TRACT | 1 refills | Status: DC

## 2020-05-12 ENCOUNTER — Other Ambulatory Visit: Payer: Self-pay

## 2020-05-12 DIAGNOSIS — IMO0001 Reserved for inherently not codable concepts without codable children: Secondary | ICD-10-CM

## 2020-05-12 MED ORDER — BUDESONIDE-FORMOTEROL FUMARATE 160-4.5 MCG/ACT IN AERO: 2.0000 | INHALATION_SPRAY | Freq: Two times a day (BID) | RESPIRATORY_TRACT | 0 refills | Status: DC

## 2020-05-26 ENCOUNTER — Telehealth: Payer: Self-pay

## 2020-05-26 NOTE — Telephone Encounter (Signed)
Confirmed and screened for 05-30-20 ov. 

## 2020-05-30 ENCOUNTER — Other Ambulatory Visit: Payer: Self-pay

## 2020-05-30 ENCOUNTER — Encounter: Payer: Self-pay | Admitting: Nurse Practitioner

## 2020-05-30 ENCOUNTER — Ambulatory Visit: Payer: Medicaid Other | Admitting: Nurse Practitioner

## 2020-05-30 VITALS — BP 145/78 | HR 88 | Temp 97.7°F | Resp 16 | Ht 63.0 in | Wt 164.6 lb

## 2020-05-30 DIAGNOSIS — Z1231 Encounter for screening mammogram for malignant neoplasm of breast: Secondary | ICD-10-CM

## 2020-05-30 DIAGNOSIS — L0211 Cutaneous abscess of neck: Secondary | ICD-10-CM | POA: Diagnosis not present

## 2020-05-30 DIAGNOSIS — B372 Candidiasis of skin and nail: Secondary | ICD-10-CM

## 2020-05-30 DIAGNOSIS — R0789 Other chest pain: Secondary | ICD-10-CM | POA: Diagnosis not present

## 2020-05-30 DIAGNOSIS — M5030 Other cervical disc degeneration, unspecified cervical region: Secondary | ICD-10-CM

## 2020-05-30 DIAGNOSIS — R079 Chest pain, unspecified: Secondary | ICD-10-CM

## 2020-05-30 MED ORDER — CLOTRIMAZOLE-BETAMETHASONE 1-0.05 % EX CREA: 1.0000 "application " | TOPICAL_CREAM | Freq: Two times a day (BID) | CUTANEOUS | 1 refills | Status: DC

## 2020-05-30 MED ORDER — CLINDAMYCIN PHOSPHATE 1 % EX SOLN: 1.0000 "application " | Freq: Two times a day (BID) | CUTANEOUS | 2 refills | Status: DC

## 2020-05-30 MED ORDER — ETODOLAC 400 MG PO TABS: 400.0000 mg | ORAL_TABLET | Freq: Two times a day (BID) | ORAL | 1 refills | Status: DC | PRN

## 2020-05-30 NOTE — Progress Notes (Signed)
[facility] [address] [place], [place] [address]  Internal MEDICINE  Office Visit Note  Patient Name: Melody Moss  [id number]  [id number]  Date of Service: 06/11/2020  Chief Complaint  Patient presents with   Follow-up    Did not do labs or chest x-ray; having neck pain and a rash   Depression   Hypertension   Quality Metric Gaps    TDAP, Mammogram    Patient is here for follow up. -rash under the breast in groin and under arms -neck pain. Getting much worse.  -intermittent chest pain, causes shortness of breath. Previous ECG, done in 2019, was normal.  -she is due to have routine labs and check of herr thyroid.       Current Medication: Outpatient Encounter Medications as of 05/30/2020  Medication Sig   amLODipine (NORVASC) 5 MG tablet TAKE 1 TABLET(5 MG) BY MOUTH DAILY   buPROPion (WELLBUTRIN XL) 150 MG 24 hr tablet TK 1 T PO QAM   busPIRone (BUSPAR) 15 MG tablet take 1 tablet by mouth three times a day (DOSAGE INCREASE)   carbamazepine (CARBATROL) 300 MG 12 hr capsule TK 2 Perrin PO QHS   carvedilol (COREG) 6.25 MG tablet TAKE 1 TABLET(6.25 MG) BY MOUTH TWICE DAILY WITH A MEAL   clindamycin (CLEOCIN T) 1 % external solution Apply 1 application topically 2 (two) times daily.   cloNIDine (CATAPRES) 0.1 MG tablet Take one tab po bid prn for elevated bp more than 160   diclofenac Sodium (VOLTAREN) 1 % GEL Apply 4 g topically 4 (four) times daily.   dicyclomine (BENTYL) 20 MG tablet Take by mouth.   ergocalciferol (DRISDOL) 50000 units capsule Take 1 capsule (50,000 Units total) by mouth once a week.   LATUDA 60 MG TABS TK 1 T PO WITH DINNER   levothyroxine (SYNTHROID) 50 MCG tablet Take 1 tablet (50 mcg total) by mouth daily. On empty stomach   NUEDEXTA 20-10 MG CAPS TK ONE C PO BID FOR PSEUDOBULBAR EFFECT   pantoprazole (PROTONIX) 40 MG tablet Take 1 tablet (40 mg total) by mouth daily.   PROAIR HFA 108 (90 Base) MCG/ACT inhaler Inhale 2  puffs by mouth every 4-6 hours as needed for wheezing and cough.   spironolactone (ALDACTONE) 50 MG tablet Take 1 tablet (50 mg total) by mouth daily.   tiZANidine (ZANAFLEX) 2 MG tablet Take 1 tablet (2 mg total) by mouth every 8 (eight) hours as needed for muscle spasms.   traMADol (ULTRAM) 50 MG tablet Take 1 tablet (50 mg total) by mouth every 12 (twelve) hours as needed.   ziprasidone (GEODON) 20 MG capsule Take by mouth.   [DISCONTINUED] budesonide-formoterol (SYMBICORT) 160-4.5 MCG/ACT inhaler Inhale 2 puffs into the lungs 2 (two) times daily.   [DISCONTINUED] clindamycin (CLEOCIN T) 1 % external solution 1 application.   [DISCONTINUED] etodolac (LODINE) 400 MG tablet Take 1 tablet (400 mg total) by mouth 2 (two) times daily as needed.   [DISCONTINUED] etodolac (LODINE) 400 MG tablet Take 1 tablet (400 mg total) by mouth 2 (two) times daily as needed.   clotrimazole-betamethasone (LOTRISONE) cream Apply 1 application topically 2 (two) times daily.   No facility-administered encounter medications on file as of 05/30/2020.    Surgical History: Past Surgical History:  Procedure Laterality Date   COSMETIC SURGERY     LAPAROSCOPIC OOPHORECTOMY     few  times   TONSILLECTOMY Bilateral     Medical History: Past Medical History:  Diagnosis Date  Bipolar 1 disorder (HCC)    Depression    Hypertension    Panic attack     Family History: Family History  Adopted: Yes    Social History   Socioeconomic History   Marital status: Single    Spouse name: Not on file   Number of children: Not on file   Years of education: Not on file   Highest education level: Not on file  Occupational History   Not on file  Tobacco Use   Smoking status: Current Every Day Smoker    Packs/day: 0.50    Types: Cigarettes   Smokeless tobacco: Never Used  Substance and Sexual Activity   Alcohol use: Yes    Comment: ocassionally    Drug use: No   Sexual activity: Not on  file  Other Topics Concern   Not on file  Social History Narrative   Not on file   Social Determinants of Health   Financial Resource Strain:    Difficulty of Paying Living Expenses:   Food Insecurity:    Worried About Programme researcher, broadcasting/film/videounning Out of Food in the Last Year:    Baristaan Out of Food in the Last Year:   Transportation Needs:    Freight forwarderLack of Transportation (Medical):    Lack of Transportation (Non-Medical):   Physical Activity:    Days of Exercise per Week:    Minutes of Exercise per Session:   Stress:    Feeling of Stress :   Social Connections:    Frequency of Communication with Friends and Family:    Frequency of Social Gatherings with Friends and Family:    Attends Religious Services:    Active Member of Clubs or Organizations:    Attends Engineer, structuralClub or Organization Meetings:    Marital Status:   Intimate Partner Violence:    Fear of Current or Ex-Partner:    Emotionally Abused:    Physically Abused:    Sexually Abused:       Review of Systems  Constitutional: Positive for fatigue and unexpected weight change. Negative for activity change and chills.       Continues to gain weight.   HENT: Negative for congestion, postnasal drip, rhinorrhea, sneezing and sore throat.   Respiratory: Positive for shortness of breath and wheezing. Negative for cough and chest tightness.        Intermittent.   Cardiovascular: Positive for chest pain. Negative for palpitations.       Intermittent in nature.   Gastrointestinal: Negative for abdominal distention, abdominal pain, constipation, diarrhea, nausea and vomiting.  Endocrine: Negative for cold intolerance, heat intolerance, polydipsia and polyuria.       Due to have thyroid panel checked.   Musculoskeletal: Positive for myalgias. Negative for arthralgias, back pain, joint swelling and neck pain.       Mostly along the right lowe rib cage and right posterior shoulder.  Skin: Negative for rash.  Allergic/Immunologic: Negative for  environmental allergies.  Neurological: Positive for dizziness. Negative for tremors, numbness and headaches.  Hematological: Negative for adenopathy. Does not bruise/bleed easily.  Psychiatric/Behavioral: Positive for behavioral problems (Depression). Negative for sleep disturbance and suicidal ideas. The patient is nervous/anxious.        Psychiatric disorder managed by mental health provider.     Today's Vitals   05/30/20 1605  BP: (!) 145/78  Pulse: 88  Resp: 16  Temp: 97.7 F (36.5 C)  SpO2: 96%  Weight: 164 lb 9.6 oz (74.7 kg)  Height: 5\' 3"  (1.6 m)  Body mass index is 29.16 kg/m.  Physical Exam Vitals and nursing note reviewed.  Constitutional:      General: She is not in acute distress.    Appearance: Normal appearance. She is well-developed. She is not diaphoretic.  HENT:     Head: Normocephalic and atraumatic.     Mouth/Throat:     Pharynx: No oropharyngeal exudate.  Eyes:     Pupils: Pupils are equal, round, and reactive to light.  Neck:     Thyroid: No thyromegaly.     Vascular: No JVD.     Trachea: No tracheal deviation.  Cardiovascular:     Rate and Rhythm: Normal rate and regular rhythm.     Heart sounds: Normal heart sounds. No murmur heard.  No friction rub. No gallop.      Comments: ECG done int the office today is borderline, showing left atrial enlargement.  Pulmonary:     Effort: Pulmonary effort is normal. No respiratory distress.     Breath sounds: Normal breath sounds. No wheezing or rales.  Chest:     Chest wall: No tenderness.  Abdominal:     Palpations: Abdomen is soft.  Musculoskeletal:        General: Normal range of motion.     Cervical back: Normal range of motion and neck supple.  Lymphadenopathy:     Cervical: No cervical adenopathy.  Skin:    General: Skin is warm and dry.  Neurological:     General: No focal deficit present.     Mental Status: She is alert and oriented to person, place, and time.     Cranial Nerves: No  cranial nerve deficit.  Psychiatric:        Behavior: Behavior normal.        Thought Content: Thought content normal.        Judgment: Judgment normal.    Assessment/Plan:  1. Chest pain, unspecified type ECG is borderline showing left atrial enlargement. Will get echocardiogram for further evaluation.  - EKG 12-Lead - ECHOCARDIOGRAM COMPLETE; Future  2. Costochondral pain Recommend use of topical voltaren gel which may be applied up to four times daily as needed for pain. Recommend she also apply ice or warm compress to effected areas as needed.   3. Abscess of skin of neck May use clindamycin extermal solution up to twice daily as needed  - clindamycin (CLEOCIN T) 1 % external solution; Apply 1 application topically 2 (two) times daily.  Dispense: 60 mL; Refill: 2  4. Cutaneous candidiasis lotrisone cream may be applied to effected area twice daily when needed - clotrimazole-betamethasone (LOTRISONE) cream; Apply 1 application topically 2 (two) times daily.  Dispense: 45 g; Refill: 1  5. DDD (degenerative disc disease), cervical Will get x-ray of cervical spine for further evaluation. Refer to neurosurgery/orthopedics as indicated.  - DG Cervical Spine Complete; Future  6. Encounter for screening mammogram for malignant neoplasm of breast - MM DIGITAL SCREENING BILATERAL; Future  General Counseling: Aliya verbalizes understanding of the findings of todays visit and agrees with plan of treatment. I have discussed any further diagnostic evaluation that may be needed or ordered today. We also reviewed her medications today. she has been encouraged to call the office with any questions or concerns that should arise related to todays visit.  This patient was seen by Vincent Gros FNP Collaboration with Dr Lyndon Code as a part of collaborative care agreement  Orders Placed This Encounter  Procedures   DG Cervical Spine  Complete   MM DIGITAL SCREENING BILATERAL   EKG 12-Lead    ECHOCARDIOGRAM COMPLETE    Meds ordered this encounter  Medications   clindamycin (CLEOCIN T) 1 % external solution    Sig: Apply 1 application topically 2 (two) times daily.    Dispense:  60 mL    Refill:  2    Order Specific Question:   Supervising Provider    Answer:   Lyndon Code [[id number]]   clotrimazole-betamethasone (LOTRISONE) cream    Sig: Apply 1 application topically 2 (two) times daily.    Dispense:  45 g    Refill:  1    Order Specific Question:   Supervising Provider    Answer:   Lyndon Code [[id number]]   DISCONTD: etodolac (LODINE) 400 MG tablet    Sig: Take 1 tablet (400 mg total) by mouth 2 (two) times daily as needed.    Dispense:  60 tablet    Refill:  1    Order Specific Question:   Supervising Provider    Answer:   Lyndon Code [[id number]]    Total time spent: 45 Minutes   Time spent includes review of chart, medications, test results, and follow up plan with the patient.      Dr Lyndon Code Internal medicine

## 2020-06-05 ENCOUNTER — Other Ambulatory Visit: Payer: Self-pay

## 2020-06-05 DIAGNOSIS — R0789 Other chest pain: Secondary | ICD-10-CM

## 2020-06-05 MED ORDER — ETODOLAC 400 MG PO TABS: 400.0000 mg | ORAL_TABLET | Freq: Two times a day (BID) | ORAL | 1 refills | Status: DC | PRN

## 2020-06-07 ENCOUNTER — Other Ambulatory Visit: Payer: Self-pay

## 2020-06-07 DIAGNOSIS — IMO0001 Reserved for inherently not codable concepts without codable children: Secondary | ICD-10-CM

## 2020-06-07 MED ORDER — BUDESONIDE-FORMOTEROL FUMARATE 160-4.5 MCG/ACT IN AERO: 2.0000 | INHALATION_SPRAY | Freq: Two times a day (BID) | RESPIRATORY_TRACT | 0 refills | Status: DC

## 2020-06-08 ENCOUNTER — Telehealth: Payer: Self-pay

## 2020-06-08 NOTE — Telephone Encounter (Signed)
Authorization approved for etodolac 400MG  tablets from 06/08/2020 through 06/03/2021 [facility]

## 2020-06-11 DIAGNOSIS — B372 Candidiasis of skin and nail: Secondary | ICD-10-CM | POA: Insufficient documentation

## 2020-06-11 DIAGNOSIS — R079 Chest pain, unspecified: Secondary | ICD-10-CM | POA: Insufficient documentation

## 2020-06-11 DIAGNOSIS — Z1231 Encounter for screening mammogram for malignant neoplasm of breast: Secondary | ICD-10-CM | POA: Insufficient documentation

## 2020-06-11 DIAGNOSIS — M5030 Other cervical disc degeneration, unspecified cervical region: Secondary | ICD-10-CM | POA: Insufficient documentation

## 2020-06-13 ENCOUNTER — Telehealth: Payer: Self-pay

## 2020-06-14 ENCOUNTER — Telehealth: Payer: Self-pay

## 2020-06-14 ENCOUNTER — Other Ambulatory Visit: Payer: Self-pay | Admitting: Nurse Practitioner

## 2020-06-14 DIAGNOSIS — R0789 Other chest pain: Secondary | ICD-10-CM

## 2020-06-14 DIAGNOSIS — M064 Inflammatory polyarthropathy: Secondary | ICD-10-CM

## 2020-06-14 DIAGNOSIS — M791 Myalgia, unspecified site: Secondary | ICD-10-CM

## 2020-06-14 MED ORDER — LIDOCAINE 5 % EX PTCH: 1.0000 | MEDICATED_PATCH | CUTANEOUS | 0 refills | Status: AC

## 2020-06-14 MED ORDER — TIZANIDINE HCL 2 MG PO TABS: 2.0000 mg | ORAL_TABLET | Freq: Three times a day (TID) | ORAL | 0 refills | Status: DC | PRN

## 2020-06-14 MED ORDER — TRAMADOL HCL 50 MG PO TABS: 50.0000 mg | ORAL_TABLET | Freq: Two times a day (BID) | ORAL | 0 refills | Status: DC | PRN

## 2020-06-14 NOTE — Telephone Encounter (Signed)
Spoke to pt and informed her that 3 prescriptions was sent to her pharmacy.  I gave her instructions per heather on what to do.  dbs

## 2020-06-14 NOTE — Progress Notes (Signed)
Sent in short term prescription for tramadol to take as needed for pain. Also added tizanidine 2mg  to take prn as prescribed for muscle pain/tightness. Finally, I added lidoderm patches. This may be applied to effected areas for 12 hours at a time. Leave off for 12 hours before applying another one.

## 2020-06-14 NOTE — Telephone Encounter (Signed)
done

## 2020-06-14 NOTE — Telephone Encounter (Signed)
Sent in short term prescription for tramadol to take as needed for pain. Also added tizanidine 2mg to take prn as prescribed for muscle pain/tightness. Finally, I added lidoderm patches. This may be applied to effected areas for 12 hours at a time. Leave off for 12 hours before applying another one.  

## 2020-06-20 ENCOUNTER — Telehealth: Payer: Self-pay

## 2020-06-20 NOTE — Telephone Encounter (Signed)
Pt advised that tramadol is ready for pickup and also we working on PA for patch

## 2020-06-21 ENCOUNTER — Telehealth: Payer: Self-pay

## 2020-06-21 NOTE — Telephone Encounter (Signed)
Confirmed and screened for office visit 8/6 

## 2020-06-23 ENCOUNTER — Other Ambulatory Visit: Payer: Self-pay

## 2020-06-23 ENCOUNTER — Telehealth: Payer: Self-pay

## 2020-06-23 ENCOUNTER — Ambulatory Visit
Admission: RE | Admit: 2020-06-23 | Discharge: 2020-06-23 | Disposition: A | Discharge status: Home / Self Care | Payer: Medicaid Other | Source: Ambulatory Visit | Attending: Nurse Practitioner | Admitting: Nurse Practitioner

## 2020-06-23 ENCOUNTER — Ambulatory Visit: Payer: Medicaid Other

## 2020-06-23 ENCOUNTER — Ambulatory Visit
Admission: RE | Admit: 2020-06-23 | Discharge: 2020-06-23 | Disposition: A | Discharge status: Home / Self Care | Payer: Medicaid Other | Attending: Nurse Practitioner | Admitting: Nurse Practitioner

## 2020-06-23 ENCOUNTER — Other Ambulatory Visit
Admission: RE | Admit: 2020-06-23 | Discharge: 2020-06-23 | Disposition: A | Discharge status: Home / Self Care | Payer: Medicaid Other | Source: Home / Self Care | Attending: Nurse Practitioner | Admitting: Nurse Practitioner

## 2020-06-23 DIAGNOSIS — R079 Chest pain, unspecified: Secondary | ICD-10-CM

## 2020-06-23 DIAGNOSIS — E559 Vitamin D deficiency, unspecified: Secondary | ICD-10-CM | POA: Insufficient documentation

## 2020-06-23 DIAGNOSIS — M5030 Other cervical disc degeneration, unspecified cervical region: Secondary | ICD-10-CM | POA: Diagnosis not present

## 2020-06-23 DIAGNOSIS — E039 Hypothyroidism, unspecified: Secondary | ICD-10-CM | POA: Insufficient documentation

## 2020-06-23 DIAGNOSIS — Z0000 Encounter for general adult medical examination without abnormal findings: Secondary | ICD-10-CM | POA: Insufficient documentation

## 2020-06-23 DIAGNOSIS — R5383 Other fatigue: Secondary | ICD-10-CM | POA: Insufficient documentation

## 2020-06-23 DIAGNOSIS — R0789 Other chest pain: Secondary | ICD-10-CM

## 2020-06-23 DIAGNOSIS — F17210 Nicotine dependence, cigarettes, uncomplicated: Secondary | ICD-10-CM | POA: Insufficient documentation

## 2020-06-23 DIAGNOSIS — Z1159 Encounter for screening for other viral diseases: Secondary | ICD-10-CM | POA: Insufficient documentation

## 2020-06-23 DIAGNOSIS — M064 Inflammatory polyarthropathy: Secondary | ICD-10-CM | POA: Insufficient documentation

## 2020-06-23 LAB — LIPID PANEL
Cholesterol: 208 mg/dL — ABNORMAL HIGH (ref 0–200)
HDL: 50 mg/dL (ref >40)
LDL Cholesterol: 99 mg/dL (ref 0–99)
Total CHOL/HDL Ratio: 4.2 RATIO
Triglycerides: 293 mg/dL — ABNORMAL HIGH (ref <150)
VLDL: 59 mg/dL — ABNORMAL HIGH (ref 0–40)

## 2020-06-23 LAB — CBC
HCT: 40.5 % (ref 36.0–46.0)
Hemoglobin: 13.5 g/dL (ref 12.0–15.0)
MCH: 31.2 pg (ref 26.0–34.0)
MCHC: 33.3 g/dL (ref 30.0–36.0)
MCV: 93.5 fL (ref 80.0–100.0)
Platelets: 205 10*3/uL (ref 150–400)
RBC: 4.33 MIL/uL (ref 3.87–5.11)
RDW: 12.7 % (ref 11.5–15.5)
WBC: 7.3 10*3/uL (ref 4.0–10.5)
nRBC: 0 % (ref 0.0–0.2)

## 2020-06-23 LAB — SEDIMENTATION RATE: Sed Rate: 5 mm/hr (ref 0–30)

## 2020-06-23 LAB — VITAMIN D 25 HYDROXY (VIT D DEFICIENCY, FRACTURES): Vit D, 25-Hydroxy: 12.94 ng/mL — ABNORMAL LOW (ref 30–100)

## 2020-06-23 LAB — COMPREHENSIVE METABOLIC PANEL
ALT: 23 U/L (ref 0–44)
AST: 19 U/L (ref 15–41)
Albumin: 4.2 g/dL (ref 3.5–5.0)
Alkaline Phosphatase: 56 U/L (ref 38–126)
Anion gap: 10 (ref 5–15)
BUN: 18 mg/dL (ref 6–20)
CO2: 23 mmol/L (ref 22–32)
Calcium: 9.1 mg/dL (ref 8.9–10.3)
Chloride: 107 mmol/L (ref 98–111)
Creatinine, Ser: 0.8 mg/dL (ref 0.44–1.00)
GFR calc Af Amer: >60 mL/min (ref >60)
GFR calc non Af Amer: >60 mL/min (ref >60)
Glucose, Bld: 98 mg/dL (ref 70–99)
Potassium: 4.1 mmol/L (ref 3.5–5.1)
Sodium: 140 mmol/L (ref 135–145)
Total Bilirubin: 0.9 mg/dL (ref 0.3–1.2)
Total Protein: 7.2 g/dL (ref 6.5–8.1)

## 2020-06-23 LAB — TSH: TSH: 3.646 u[IU]/mL (ref 0.350–4.500)

## 2020-06-23 LAB — T4, FREE: Free T4: 0.89 ng/dL (ref 0.61–1.12)

## 2020-06-23 LAB — HEPATITIS C ANTIBODY: HCV Ab: NONREACTIVE

## 2020-06-23 NOTE — Telephone Encounter (Signed)
Authorization approved for Lidocaine patches from 06/21/2020 through 06/16/2021 [facility]

## 2020-06-24 LAB — RHEUMATOID FACTOR: Rheumatoid fact SerPl-aCnc: <10 IU/mL (ref 0.0–13.9)

## 2020-06-24 LAB — ANA W/REFLEX: Anti Nuclear Antibody (ANA): NEGATIVE

## 2020-06-28 NOTE — Progress Notes (Signed)
Reviewed. Discuss at next visit. Also low vitamin d and elevated lipid panel.

## 2020-07-04 ENCOUNTER — Telehealth: Payer: Self-pay

## 2020-07-04 NOTE — Telephone Encounter (Signed)
Confirmed and screened for 07-06-20 ov. 

## 2020-07-06 ENCOUNTER — Encounter: Payer: Self-pay | Admitting: Nurse Practitioner

## 2020-07-06 ENCOUNTER — Ambulatory Visit: Payer: Medicaid Other | Admitting: Nurse Practitioner

## 2020-07-06 VITALS — BP 134/86 | HR 84 | Temp 97.8°F | Resp 16 | Ht 63.0 in | Wt 163.2 lb

## 2020-07-06 DIAGNOSIS — M064 Inflammatory polyarthropathy: Secondary | ICD-10-CM | POA: Diagnosis not present

## 2020-07-06 DIAGNOSIS — M5030 Other cervical disc degeneration, unspecified cervical region: Secondary | ICD-10-CM

## 2020-07-06 DIAGNOSIS — E782 Mixed hyperlipidemia: Secondary | ICD-10-CM | POA: Diagnosis not present

## 2020-07-06 DIAGNOSIS — R079 Chest pain, unspecified: Secondary | ICD-10-CM

## 2020-07-06 DIAGNOSIS — F17210 Nicotine dependence, cigarettes, uncomplicated: Secondary | ICD-10-CM

## 2020-07-06 MED ORDER — ROSUVASTATIN CALCIUM 5 MG PO TABS: 5.0000 mg | ORAL_TABLET | Freq: Every day | ORAL | 3 refills | Status: DC

## 2020-07-06 MED ORDER — DICLOFENAC SODIUM 1 % EX GEL: 4.0000 g | Freq: Four times a day (QID) | CUTANEOUS | 2 refills | Status: DC

## 2020-07-06 NOTE — Progress Notes (Signed)
[facility] [address] [place], [place] [address]  Internal MEDICINE  Office Visit Note  Patient Name: Melody Moss  [id number]  [id number]  Date of Service: 07/12/2020  Chief Complaint  Patient presents with  . Follow-up    test results, refill request clindamcin  . Hypertension  . Depression  . Quality Metric Gaps    HIV screening, TDAP    The patient is here for follow up visit. At her most recent visit, she had been complaining of intermittent chest pain. ECG in the office showed evidence of left atrial enlargement, but was likely bening in nature. She has since had chest x-ray which was negative for acute cardiopulmonary disease, and echo cardiogram. She has normal LVEF and trace tricuspid and mitral valve regurgitation.  She was also having neck pain which had been getting worse. This pain is radiating into her shoulders and down her arm. She had x-ray of the cervical spine. X-ray showing multi-level disc space loss with osteophyte formation. This is most severe at C6/C7. There is also moderate to severe right neuroforaminal narrowing at C3/C4 level.  She had routine, fasting labs done. Her lipid panel has mild to moderate, generalized elevation and her vitamin d is low.       Current Medication: Outpatient Encounter Medications as of 07/06/2020  Medication Sig  . amLODipine (NORVASC) 5 MG tablet TAKE 1 TABLET(5 MG) BY MOUTH DAILY  . buPROPion (WELLBUTRIN XL) 150 MG 24 hr tablet TK 1 T PO QAM  . carbamazepine (CARBATROL) 300 MG 12 hr capsule TK 2 [place] PO QHS  . clindamycin (CLEOCIN T) 1 % external solution Apply 1 application topically 2 (two) times daily.  . cloNIDine (CATAPRES) 0.1 MG tablet Take one tab po bid prn for elevated bp more than 160  . clotrimazole-betamethasone (LOTRISONE) cream Apply 1 application topically 2 (two) times daily.  Marland Kitchen dicyclomine (BENTYL) 20 MG tablet Take by mouth.  . ergocalciferol (DRISDOL) 50000 units capsule Take 1 capsule  (50,000 Units total) by mouth once a week.  . etodolac (LODINE) 400 MG tablet Take 1 tablet (400 mg total) by mouth 2 (two) times daily as needed.  Marland Kitchen LATUDA 60 MG TABS TK 1 T PO WITH DINNER  . levothyroxine (SYNTHROID) 50 MCG tablet Take 1 tablet (50 mcg total) by mouth daily. On empty stomach  . lidocaine (LIDODERM) 5 % Place 1 patch onto the skin daily. Remove & Discard patch within 12 hours or as directed by MD  . NUEDEXTA 20-10 MG CAPS TK ONE C PO BID FOR PSEUDOBULBAR EFFECT  . pantoprazole (PROTONIX) 40 MG tablet Take 1 tablet (40 mg total) by mouth daily.  Marland Kitchen spironolactone (ALDACTONE) 50 MG tablet Take 1 tablet (50 mg total) by mouth daily.  Marland Kitchen tiZANidine (ZANAFLEX) 2 MG tablet Take 1 tablet (2 mg total) by mouth every 8 (eight) hours as needed for muscle spasms.  . traMADol (ULTRAM) 50 MG tablet Take 1 tablet (50 mg total) by mouth every 12 (twelve) hours as needed.  . ziprasidone (GEODON) 20 MG capsule Take by mouth.  . [DISCONTINUED] budesonide-formoterol (SYMBICORT) 160-4.5 MCG/ACT inhaler Inhale 2 puffs into the lungs 2 (two) times daily.  . [DISCONTINUED] PROAIR HFA 108 (90 Base) MCG/ACT inhaler Inhale 2 puffs by mouth every 4-6 hours as needed for wheezing and cough.  . busPIRone (BUSPAR) 15 MG tablet take 1 tablet by mouth three times a day (DOSAGE INCREASE) (Patient not taking: Reported on 07/06/2020)  . carvedilol (COREG) 6.25 MG  tablet TAKE 1 TABLET(6.25 MG) BY MOUTH TWICE DAILY WITH A MEAL (Patient not taking: Reported on 07/06/2020)  . diclofenac Sodium (VOLTAREN) 1 % GEL Apply 4 g topically 4 (four) times daily.  . rosuvastatin (CRESTOR) 5 MG tablet Take 1 tablet (5 mg total) by mouth daily.   No facility-administered encounter medications on file as of 07/06/2020.    Surgical History: Past Surgical History:  Procedure Laterality Date  . COSMETIC SURGERY    . LAPAROSCOPIC OOPHORECTOMY     few  times  . TONSILLECTOMY Bilateral     Medical History: Past Medical History:   Diagnosis Date  . Bipolar 1 disorder (HCC)   . Depression   . Hypertension   . Panic attack     Family History: Family History  Adopted: Yes    Social History   Socioeconomic History  . Marital status: Single    Spouse name: Not on file  . Number of children: Not on file  . Years of education: Not on file  . Highest education level: Not on file  Occupational History  . Not on file  Tobacco Use  . Smoking status: Current Every Day Smoker    Packs/day: 0.50    Types: Cigarettes  . Smokeless tobacco: Never Used  Substance and Sexual Activity  . Alcohol use: Yes    Comment: ocassionally   . Drug use: No  . Sexual activity: Not on file  Other Topics Concern  . Not on file  Social History Narrative  . Not on file   Social Determinants of Health   Financial Resource Strain:   . Difficulty of Paying Living Expenses: Not on file  Food Insecurity:   . Worried About Programme researcher, broadcasting/film/video in the Last Year: Not on file  . Ran Out of Food in the Last Year: Not on file  Transportation Needs:   . Lack of Transportation (Medical): Not on file  . Lack of Transportation (Non-Medical): Not on file  Physical Activity:   . Days of Exercise per Week: Not on file  . Minutes of Exercise per Session: Not on file  Stress:   . Feeling of Stress : Not on file  Social Connections:   . Frequency of Communication with Friends and Family: Not on file  . Frequency of Social Gatherings with Friends and Family: Not on file  . Attends Religious Services: Not on file  . Active Member of Clubs or Organizations: Not on file  . Attends Banker Meetings: Not on file  . Marital Status: Not on file  Intimate Partner Violence:   . Fear of Current or Ex-Partner: Not on file  . Emotionally Abused: Not on file  . Physically Abused: Not on file  . Sexually Abused: Not on file      Review of Systems  Constitutional: Positive for fatigue. Negative for activity change, chills and  unexpected weight change.  HENT: Negative for congestion, postnasal drip, rhinorrhea, sneezing and sore throat.   Respiratory: Positive for shortness of breath and wheezing. Negative for cough and chest tightness.        Intermittent.   Cardiovascular: Positive for chest pain. Negative for palpitations.       Intermittent in nature.   Gastrointestinal: Negative for abdominal distention, abdominal pain, constipation, diarrhea, nausea and vomiting.  Endocrine: Negative for cold intolerance, heat intolerance, polydipsia and polyuria.       Thyroid panel normal with recent lab check.   Musculoskeletal: Positive for myalgias, neck  pain and neck stiffness. Negative for arthralgias, back pain and joint swelling.       Mostly along the right lowe rib cage and right posterior shoulder.  Skin: Negative for rash.  Allergic/Immunologic: Negative for environmental allergies.  Neurological: Positive for dizziness. Negative for tremors, numbness and headaches.  Hematological: Negative for adenopathy. Does not bruise/bleed easily.  Psychiatric/Behavioral: Positive for behavioral problems (Depression). Negative for sleep disturbance and suicidal ideas. The patient is nervous/anxious.        Psychiatric disorder managed by mental health provider.    Today's Vitals   07/06/20 1624  BP: 134/86  Pulse: 84  Resp: 16  Temp: 97.8 F (36.6 C)  SpO2: 96%  Weight: 163 lb 3.2 oz (74 kg)  Height: 5\' 3"  (1.6 m)   Body mass index is 28.91 kg/m.  Physical Exam Vitals and nursing note reviewed.  Constitutional:      General: She is not in acute distress.    Appearance: Normal appearance. She is well-developed. She is not diaphoretic.  HENT:     Head: Normocephalic and atraumatic.     Mouth/Throat:     Pharynx: No oropharyngeal exudate.  Eyes:     Pupils: Pupils are equal, round, and reactive to light.  Neck:     Thyroid: No thyromegaly.     Vascular: No JVD.     Trachea: No tracheal deviation.   Cardiovascular:     Rate and Rhythm: Normal rate and regular rhythm.     Heart sounds: Normal heart sounds. No murmur heard.  No friction rub. No gallop.   Pulmonary:     Effort: Pulmonary effort is normal. No respiratory distress.     Breath sounds: Normal breath sounds. No wheezing or rales.  Chest:     Chest wall: No tenderness.  Abdominal:     Palpations: Abdomen is soft.  Musculoskeletal:     Cervical back: Neck supple. Pain with movement, spinous process tenderness and muscular tenderness present. Decreased range of motion.  Lymphadenopathy:     Cervical: No cervical adenopathy.  Skin:    General: Skin is warm and dry.  Neurological:     General: No focal deficit present.     Mental Status: She is alert and oriented to person, place, and time.     Cranial Nerves: No cranial nerve deficit.  Psychiatric:        Attention and Perception: Attention and perception normal.        Mood and Affect: Affect normal. Mood is anxious.        Speech: Speech normal.        Behavior: Behavior normal.        Thought Content: Thought content normal.        Cognition and Memory: Cognition and memory normal.        Judgment: Judgment normal.    Assessment/Plan: 1. Mixed hyperlipidemia Reviewed labs with patient. There is moderate, generalized elevation of lipid panel. Start rosuvastatin 5mg  every day with supper. Will recheck panel in approximately throe months.  - rosuvastatin (CRESTOR) 5 MG tablet; Take 1 tablet (5 mg total) by mouth daily.  Dispense: 30 tablet; Refill: 3  2. DDD (degenerative disc disease), cervical Reviewed x-ray of cervical spine with the patient. X-ray showing multi-level disc space loss with osteophyte formation. This is most severe at C6/C7. There is also moderate to severe right neuroforaminal narrowing at C3/C4 level. Will refer to orthopedics for further evaluation and treatment.  - Ambulatory referral to Orthopedic  Surgery  3. Inflammatory polyarthritis  (HCC) May apply voltaren gel to all effected areas up to four times daily as needed for pain.  - diclofenac Sodium (VOLTAREN) 1 % GEL; Apply 4 g topically 4 (four) times daily.  Dispense: 100 g; Refill: 2  4. Chest pain, unspecified type Unclear etiology. Patient had chest x-ray and echocardiogram since she was last seen. chest x-ray was negative for acute cardiopulmonary disease, and echo cardiogram. She has normal LVEF and trace tricuspid and mitral valve regurgitation. Will continue to monitor.   5. Cigarette smoker Discussed importance of smoking cessation with the patient.   General Counseling: Brandis verbalizes understanding of the findings of todays visit and agrees with plan of treatment. I have discussed any further diagnostic evaluation that may be needed or ordered today. We also reviewed her medications today. she has been encouraged to call the office with any questions or concerns that should arise related to todays visit.   This patient was seen by Vincent GrosHeather Lillia Lengel FNP Collaboration with Dr Lyndon CodeFozia M Khan as a part of collaborative care agreement  Orders Placed This Encounter  Procedures  . Ambulatory referral to Orthopedic Surgery    Meds ordered this encounter  Medications  . rosuvastatin (CRESTOR) 5 MG tablet    Sig: Take 1 tablet (5 mg total) by mouth daily.    Dispense:  30 tablet    Refill:  3    Order Specific Question:   Supervising Provider    Answer:   Lyndon CodeKHAN, FOZIA M [[id number]]  . diclofenac Sodium (VOLTAREN) 1 % GEL    Sig: Apply 4 g topically 4 (four) times daily.    Dispense:  100 g    Refill:  2    Order Specific Question:   Supervising Provider    Answer:   Lyndon CodeKHAN, FOZIA M [[id number]]    Total time spent: 45 Minutes  Time spent includes review of chart, medications, test results, and follow up plan with the patient.      Dr Lyndon CodeFozia M Khan Internal medicine

## 2020-07-07 ENCOUNTER — Other Ambulatory Visit: Payer: Self-pay

## 2020-07-07 DIAGNOSIS — IMO0001 Reserved for inherently not codable concepts without codable children: Secondary | ICD-10-CM

## 2020-07-07 DIAGNOSIS — R0602 Shortness of breath: Secondary | ICD-10-CM

## 2020-07-07 DIAGNOSIS — J4520 Mild intermittent asthma, uncomplicated: Secondary | ICD-10-CM

## 2020-07-07 MED ORDER — BUDESONIDE-FORMOTEROL FUMARATE 160-4.5 MCG/ACT IN AERO: 2.0000 | INHALATION_SPRAY | Freq: Two times a day (BID) | RESPIRATORY_TRACT | 3 refills | Status: DC

## 2020-07-07 MED ORDER — PROAIR HFA 108 (90 BASE) MCG/ACT IN AERS: INHALATION_SPRAY | RESPIRATORY_TRACT | 1 refills | Status: DC

## 2020-07-12 DIAGNOSIS — E782 Mixed hyperlipidemia: Secondary | ICD-10-CM | POA: Insufficient documentation

## 2020-09-22 ENCOUNTER — Other Ambulatory Visit: Payer: Self-pay

## 2020-09-22 DIAGNOSIS — I10 Essential (primary) hypertension: Secondary | ICD-10-CM

## 2020-09-22 MED ORDER — CARVEDILOL 6.25 MG PO TABS: ORAL_TABLET | ORAL | 1 refills | Status: DC

## 2020-11-03 ENCOUNTER — Other Ambulatory Visit: Payer: Self-pay

## 2020-11-03 DIAGNOSIS — R0602 Shortness of breath: Secondary | ICD-10-CM

## 2020-11-03 MED ORDER — BUDESONIDE-FORMOTEROL FUMARATE 160-4.5 MCG/ACT IN AERO: 2.0000 | INHALATION_SPRAY | Freq: Two times a day (BID) | RESPIRATORY_TRACT | 1 refills | Status: DC

## 2020-11-06 ENCOUNTER — Other Ambulatory Visit: Payer: Medicaid Other | Admitting: Nurse Practitioner

## 2020-12-06 ENCOUNTER — Other Ambulatory Visit: Payer: Self-pay

## 2020-12-06 DIAGNOSIS — E782 Mixed hyperlipidemia: Secondary | ICD-10-CM

## 2020-12-06 DIAGNOSIS — I10 Essential (primary) hypertension: Secondary | ICD-10-CM

## 2020-12-06 MED ORDER — AMLODIPINE BESYLATE 5 MG PO TABS: ORAL_TABLET | ORAL | 0 refills | Status: DC

## 2020-12-06 MED ORDER — ROSUVASTATIN CALCIUM 5 MG PO TABS: 5.0000 mg | ORAL_TABLET | Freq: Every day | ORAL | 1 refills | Status: DC

## 2020-12-07 ENCOUNTER — Other Ambulatory Visit: Payer: Self-pay | Admitting: Internal Medicine

## 2020-12-07 DIAGNOSIS — E782 Mixed hyperlipidemia: Secondary | ICD-10-CM

## 2020-12-09 ENCOUNTER — Other Ambulatory Visit: Payer: Self-pay | Admitting: Internal Medicine

## 2020-12-09 DIAGNOSIS — E782 Mixed hyperlipidemia: Secondary | ICD-10-CM

## 2020-12-20 ENCOUNTER — Other Ambulatory Visit: Payer: Self-pay

## 2020-12-20 ENCOUNTER — Ambulatory Visit (INDEPENDENT_AMBULATORY_CARE_PROVIDER_SITE_OTHER): Payer: Medicaid Other | Admitting: Hospice and Palliative Medicine

## 2020-12-20 ENCOUNTER — Encounter: Payer: Self-pay | Admitting: Hospice and Palliative Medicine

## 2020-12-20 VITALS — BP 150/98 | HR 86 | Temp 97.3°F | Resp 16 | Ht 63.0 in | Wt 156.4 lb

## 2020-12-20 DIAGNOSIS — I10 Essential (primary) hypertension: Secondary | ICD-10-CM

## 2020-12-20 DIAGNOSIS — F17219 Nicotine dependence, cigarettes, with unspecified nicotine-induced disorders: Secondary | ICD-10-CM | POA: Diagnosis not present

## 2020-12-20 DIAGNOSIS — E782 Mixed hyperlipidemia: Secondary | ICD-10-CM

## 2020-12-20 DIAGNOSIS — Z124 Encounter for screening for malignant neoplasm of cervix: Secondary | ICD-10-CM | POA: Diagnosis not present

## 2020-12-20 DIAGNOSIS — R0602 Shortness of breath: Secondary | ICD-10-CM

## 2020-12-20 DIAGNOSIS — R300 Dysuria: Secondary | ICD-10-CM

## 2020-12-20 DIAGNOSIS — J449 Chronic obstructive pulmonary disease, unspecified: Secondary | ICD-10-CM

## 2020-12-20 DIAGNOSIS — Z1231 Encounter for screening mammogram for malignant neoplasm of breast: Secondary | ICD-10-CM

## 2020-12-20 DIAGNOSIS — K219 Gastro-esophageal reflux disease without esophagitis: Secondary | ICD-10-CM

## 2020-12-20 DIAGNOSIS — Z0001 Encounter for general adult medical examination with abnormal findings: Secondary | ICD-10-CM

## 2020-12-20 MED ORDER — AMLODIPINE BESYLATE 10 MG PO TABS: 10.0000 mg | ORAL_TABLET | Freq: Every day | ORAL | 3 refills | Status: DC

## 2020-12-20 MED ORDER — ROSUVASTATIN CALCIUM 5 MG PO TABS: 5.0000 mg | ORAL_TABLET | Freq: Every day | ORAL | 0 refills | Status: DC

## 2020-12-20 MED ORDER — BUDESONIDE-FORMOTEROL FUMARATE 160-4.5 MCG/ACT IN AERO: 2.0000 | INHALATION_SPRAY | Freq: Two times a day (BID) | RESPIRATORY_TRACT | 1 refills | Status: DC

## 2020-12-20 MED ORDER — OMEPRAZOLE 40 MG PO CPDR: 40.0000 mg | DELAYED_RELEASE_CAPSULE | Freq: Every day | ORAL | 3 refills | Status: DC

## 2020-12-20 MED ORDER — CLONIDINE HCL 0.1 MG PO TABS: ORAL_TABLET | ORAL | 3 refills | Status: DC

## 2020-12-20 MED ORDER — CARVEDILOL 6.25 MG PO TABS: ORAL_TABLET | ORAL | 1 refills | Status: DC

## 2020-12-20 NOTE — Progress Notes (Signed)
[facility] [address] [place], [place] [address]  Internal MEDICINE  Office Visit Note  Patient Name: Melody Moss  [id number]  [id number]  Date of Service: 12/23/2020  Chief Complaint  Patient presents with  . Annual Exam    Dizzy, discuss meds  . Depression  . Hypertension  . Anxiety  . Quality Metric Gaps    Colonoscopy, mammogram     HPI Pt is here for routine health maintenance examination Overall, things have been going well Has had no further episodes of chest pain from previous visits--EKG, CXR and echo findings consistent with trace TR and MR  Neck pain has also improved--undergoing PT for cervical radiculopathy  Reviewed recent labs--abnormal lipid panel, has not been started on statin therapy  Has been without several of her medications including her BP medication--unsure of which exact medication Requesting refills of all medications today  PHM: Needs mammogram Colonoscopy--she is not interested,will consider Cologuard testing  Unfortunately she does continue to smoke about a pack per day--explains she is not in a good place in her life to consider cessation  Continues to sleep well, no recent changes in her appetite or bowel habits  Current Medication: Outpatient Encounter Medications as of 12/20/2020  Medication Sig  . amLODipine (NORVASC) 10 MG tablet Take 1 tablet (10 mg total) by mouth daily.  Marland Kitchen omeprazole (PRILOSEC) 40 MG capsule Take 1 capsule (40 mg total) by mouth daily.  . budesonide-formoterol (SYMBICORT) 160-4.5 MCG/ACT inhaler Inhale 2 puffs into the lungs 2 (two) times daily.  Marland Kitchen buPROPion (WELLBUTRIN XL) 150 MG 24 hr tablet TK 1 T PO QAM  . carbamazepine (CARBATROL) 300 MG 12 hr capsule TK 2 [place] PO QHS  . carvedilol (COREG) 6.25 MG tablet TAKE 1 TABLET(6.25 MG) BY MOUTH TWICE DAILY WITH A MEAL  . clindamycin (CLEOCIN T) 1 % external solution Apply 1 application topically 2 (two) times daily.  . cloNIDine (CATAPRES) 0.1 MG  tablet Take one tab po bid prn for elevated bp more than 160  . clotrimazole-betamethasone (LOTRISONE) cream Apply 1 application topically 2 (two) times daily.  . diclofenac Sodium (VOLTAREN) 1 % GEL Apply 4 g topically 4 (four) times daily.  Marland Kitchen dicyclomine (BENTYL) 20 MG tablet Take by mouth.  Marland Kitchen LATUDA 60 MG TABS TK 1 T PO WITH DINNER  . levothyroxine (SYNTHROID) 50 MCG tablet Take 1 tablet (50 mcg total) by mouth daily. On empty stomach  . lidocaine (LIDODERM) 5 % Place 1 patch onto the skin daily. Remove & Discard patch within 12 hours or as directed by MD  . NUEDEXTA 20-10 MG CAPS TK ONE C PO BID FOR PSEUDOBULBAR EFFECT  . pantoprazole (PROTONIX) 40 MG tablet Take 1 tablet (40 mg total) by mouth daily.  Marland Kitchen PROAIR HFA 108 (90 Base) MCG/ACT inhaler Inhale 2 puffs by mouth every 4-6 hours as needed for wheezing and cough.  . rosuvastatin (CRESTOR) 5 MG tablet Take 1 tablet (5 mg total) by mouth daily.  . [DISCONTINUED] amLODipine (NORVASC) 5 MG tablet TAKE 1 TABLET(5 MG) BY MOUTH DAILY  . [DISCONTINUED] budesonide-formoterol (SYMBICORT) 160-4.5 MCG/ACT inhaler Inhale 2 puffs into the lungs 2 (two) times daily.  . [DISCONTINUED] busPIRone (BUSPAR) 15 MG tablet take 1 tablet by mouth three times a day (DOSAGE INCREASE) (Patient not taking: Reported on 07/06/2020)  . [DISCONTINUED] carvedilol (COREG) 6.25 MG tablet TAKE 1 TABLET(6.25 MG) BY MOUTH TWICE DAILY WITH A MEAL  . [DISCONTINUED] cloNIDine (CATAPRES) 0.1 MG tablet Take one tab po  bid prn for elevated bp more than 160  . [DISCONTINUED] ergocalciferol (DRISDOL) 50000 units capsule Take 1 capsule (50,000 Units total) by mouth once a week.  . [DISCONTINUED] etodolac (LODINE) 400 MG tablet Take 1 tablet (400 mg total) by mouth 2 (two) times daily as needed.  . [DISCONTINUED] rosuvastatin (CRESTOR) 5 MG tablet TAKE 1 TABLET BY MOUTH DAILY  . [DISCONTINUED] spironolactone (ALDACTONE) 50 MG tablet Take 1 tablet (50 mg total) by mouth daily.  .  [DISCONTINUED] tiZANidine (ZANAFLEX) 2 MG tablet Take 1 tablet (2 mg total) by mouth every 8 (eight) hours as needed for muscle spasms.  . [DISCONTINUED] traMADol (ULTRAM) 50 MG tablet Take 1 tablet (50 mg total) by mouth every 12 (twelve) hours as needed.  . [DISCONTINUED] ziprasidone (GEODON) 20 MG capsule Take by mouth.   No facility-administered encounter medications on file as of 12/20/2020.    Surgical History: Past Surgical History:  Procedure Laterality Date  . COSMETIC SURGERY    . LAPAROSCOPIC OOPHORECTOMY     few  times  . TONSILLECTOMY Bilateral     Medical History: Past Medical History:  Diagnosis Date  . Bipolar 1 disorder (HCC)   . Depression   . Hypertension   . Panic attack     Family History: Family History  Adopted: Yes      Review of Systems  Constitutional: Negative for chills, diaphoresis and fatigue.  HENT: Negative for ear pain, postnasal drip and sinus pressure.   Eyes: Negative for photophobia, discharge, redness, itching and visual disturbance.  Respiratory: Negative for cough, shortness of breath and wheezing.   Cardiovascular: Negative for chest pain, palpitations and leg swelling.  Gastrointestinal: Negative for abdominal pain, constipation, diarrhea, nausea and vomiting.  Genitourinary: Negative for dysuria and flank pain.  Musculoskeletal: Positive for arthralgias and neck pain. Negative for back pain and gait problem.  Skin: Negative for color change.  Allergic/Immunologic: Negative for environmental allergies and food allergies.  Neurological: Negative for dizziness and headaches.  Hematological: Does not bruise/bleed easily.  Psychiatric/Behavioral: Negative for agitation, behavioral problems (depression) and hallucinations.     Vital Signs: BP (!) 150/98   Pulse 86   Temp (!) 97.3 F (36.3 C)   Resp 16   Ht 5\' 3"  (1.6 m)   Wt 156 lb 6.4 oz (70.9 kg)   SpO2 98%   BMI 27.71 kg/m    Physical Exam Vitals reviewed.   Constitutional:      Appearance: Normal appearance. She is normal weight.  Cardiovascular:     Rate and Rhythm: Normal rate and regular rhythm.     Pulses: Normal pulses.     Heart sounds: Normal heart sounds.  Pulmonary:     Effort: Pulmonary effort is normal.     Breath sounds: Normal breath sounds.  Chest:  Breasts:     Right: Normal.     Left: Normal.    Abdominal:     General: Abdomen is flat.     Palpations: Abdomen is soft.  Musculoskeletal:        General: Normal range of motion.     Cervical back: Normal range of motion.  Skin:    General: Skin is warm.  Neurological:     General: No focal deficit present.     Mental Status: She is alert and oriented to person, place, and time. Mental status is at baseline.  Psychiatric:        Mood and Affect: Mood normal.  Behavior: Behavior normal.        Thought Content: Thought content normal.        Judgment: Judgment normal.      LABS: Recent Results (from the past 2160 hour(s))  UA/M w/rflx Culture, Routine     Status: None   Collection Time: 12/20/20  2:50 PM   Specimen: Urine   Urine  Result Value Ref Range   Specific Gravity, UA 1.009 1.005 - 1.030   pH, UA 5.0 5.0 - 7.5   Color, UA Yellow Yellow   Appearance Ur Clear Clear   Leukocytes,UA Negative Negative   Protein,UA Negative Negative/Trace   Glucose, UA Negative Negative   Ketones, UA Negative Negative   RBC, UA Negative Negative   Bilirubin, UA Negative Negative   Urobilinogen, Ur 0.2 0.2 - 1.0 mg/dL   Nitrite, UA Negative Negative   Microscopic Examination Comment     Comment: Microscopic follows if indicated.   Microscopic Examination See below:     Comment: Microscopic was indicated and was performed.   Urinalysis Reflex Comment     Comment: This specimen will not reflex to a Urine Culture.  Microscopic Examination     Status: None   Collection Time: 12/20/20  2:50 PM   Urine  Result Value Ref Range   WBC, UA 0-5 0 - 5 /hpf   RBC  None seen 0 - 2 /hpf   Epithelial Cells (non renal) 0-10 0 - 10 /hpf   Casts None seen None seen /lpf   Bacteria, UA None seen None seen/Few    Assessment/Plan: 1. Encounter for routine adult health examination with abnormal findings Well appearing 60 year old female Needs mammogram updated Will send for Cologuard testing  2. Chronic obstructive pulmonary disease, unspecified COPD type (HCC) Will need PFT Requesting refills - budesonide-formoterol (SYMBICORT) 160-4.5 MCG/ACT inhaler; Inhale 2 puffs into the lungs 2 (two) times daily.  Dispense: 1 each; Refill: 1  3. Essential hypertension, malignant BP elevated today likely due to being without her medications Requesting refills of all medicine--educated to not go without medication--contact office if she has run out of medication - carvedilol (COREG) 6.25 MG tablet; TAKE 1 TABLET(6.25 MG) BY MOUTH TWICE DAILY WITH A MEAL  Dispense: 180 tablet; Refill: 1 - cloNIDine (CATAPRES) 0.1 MG tablet; Take one tab po bid prn for elevated bp more than 160  Dispense: 60 tablet; Refill: 3 - amLODipine (NORVASC) 10 MG tablet; Take 1 tablet (10 mg total) by mouth daily.  Dispense: 90 tablet; Refill: 3  4. Mixed hyperlipidemia Lipid panel abnormal lipid on last fasting labs--start low dose Crestor Will need further vascular studies - rosuvastatin (CRESTOR) 5 MG tablet; Take 1 tablet (5 mg total) by mouth daily.  Dispense: 90 tablet; Refill: 0  5. Gastroesophageal reflux disease without esophagitis Stable, requesting refills - omeprazole (PRILOSEC) 40 MG capsule; Take 1 capsule (40 mg total) by mouth daily.  Dispense: 90 capsule; Refill: 3  6. Encounter for screening mammogram for malignant neoplasm of breast - MM Digital Screening; Future  7. Cigarette nicotine dependence with nicotine-induced disorder Smoking cessation counseling: 1. Pt acknowledges the risks of long term smoking, she will try to quite smoking. 2. Options for different  medications including nicotine products, chewing gum, patch etc, Wellbutrin and Chantix is discussed 3. Goal and date of compete cessation is discussed 4. Total time spent in smoking cessation is 15 min.   8. Dysuria - UA/M w/rflx Culture, Routine - Microscopic Examination  General Counseling:  Jamell verbalizes understanding of the findings of todays visit and agrees with plan of treatment. I have discussed any further diagnostic evaluation that may be needed or ordered today. We also reviewed her medications today. she has been encouraged to call the office with any questions or concerns that should arise related to todays visit.    Counseling:    Orders Placed This Encounter  Procedures  . Microscopic Examination  . MM Digital Screening  . UA/M w/rflx Culture, Routine    Meds ordered this encounter  Medications  . budesonide-formoterol (SYMBICORT) 160-4.5 MCG/ACT inhaler    Sig: Inhale 2 puffs into the lungs 2 (two) times daily.    Dispense:  1 each    Refill:  1    Samples provided .  . carvedilol (COREG) 6.25 MG tablet    Sig: TAKE 1 TABLET(6.25 MG) BY MOUTH TWICE DAILY WITH A MEAL    Dispense:  180 tablet    Refill:  1  . cloNIDine (CATAPRES) 0.1 MG tablet    Sig: Take one tab po bid prn for elevated bp more than 160    Dispense:  60 tablet    Refill:  3  . omeprazole (PRILOSEC) 40 MG capsule    Sig: Take 1 capsule (40 mg total) by mouth daily.    Dispense:  90 capsule    Refill:  3  . rosuvastatin (CRESTOR) 5 MG tablet    Sig: Take 1 tablet (5 mg total) by mouth daily.    Dispense:  90 tablet    Refill:  0    ZERO refills remain on this prescription. Your patient is requesting advance approval of refills for this medication to PREVENT ANY MISSED DOSES  . amLODipine (NORVASC) 10 MG tablet    Sig: Take 1 tablet (10 mg total) by mouth daily.    Dispense:  90 tablet    Refill:  3    Total time spent: 30 Minutes  Time spent includes review of chart, medications,  test results, and follow up plan with the patient.   This patient was seen by Brent General AGNP-C Collaboration with Dr Lyndon Code as a part of collaborative care agreement   Lubertha Basque. [facility] Internal Medicine

## 2020-12-21 LAB — UA/M W/RFLX CULTURE, ROUTINE
Bilirubin, UA: NEGATIVE
Glucose, UA: NEGATIVE
Ketones, UA: NEGATIVE
Leukocytes,UA: NEGATIVE
Nitrite, UA: NEGATIVE
Protein,UA: NEGATIVE
RBC, UA: NEGATIVE
Specific Gravity, UA: 1.009 (ref 1.005–1.030)
Urobilinogen, Ur: 0.2 mg/dL (ref 0.2–1.0)
pH, UA: 5 (ref 5.0–7.5)

## 2020-12-21 LAB — MICROSCOPIC EXAMINATION
Bacteria, UA: NONE SEEN
Casts: NONE SEEN /lpf
RBC, Urine: NONE SEEN /hpf (ref 0–2)

## 2020-12-23 ENCOUNTER — Encounter: Payer: Self-pay | Admitting: Hospice and Palliative Medicine

## 2020-12-28 ENCOUNTER — Ambulatory Visit: Payer: Medicaid Other | Admitting: Hospice and Palliative Medicine

## 2021-01-08 ENCOUNTER — Other Ambulatory Visit: Payer: Self-pay

## 2021-01-08 DIAGNOSIS — E039 Hypothyroidism, unspecified: Secondary | ICD-10-CM

## 2021-01-08 MED ORDER — LEVOTHYROXINE SODIUM 50 MCG PO TABS: 50.0000 ug | ORAL_TABLET | Freq: Every day | ORAL | 3 refills | Status: DC

## 2021-03-03 ENCOUNTER — Other Ambulatory Visit: Payer: Self-pay | Admitting: Internal Medicine

## 2021-03-03 ENCOUNTER — Other Ambulatory Visit: Payer: Self-pay | Admitting: Hospice and Palliative Medicine

## 2021-03-03 DIAGNOSIS — I10 Essential (primary) hypertension: Secondary | ICD-10-CM

## 2021-03-03 DIAGNOSIS — E782 Mixed hyperlipidemia: Secondary | ICD-10-CM

## 2021-03-07 ENCOUNTER — Other Ambulatory Visit: Payer: Self-pay

## 2021-03-07 ENCOUNTER — Other Ambulatory Visit: Payer: Self-pay | Admitting: Internal Medicine

## 2021-03-07 DIAGNOSIS — I10 Essential (primary) hypertension: Secondary | ICD-10-CM

## 2021-03-17 ENCOUNTER — Other Ambulatory Visit: Payer: Self-pay | Admitting: Hospice and Palliative Medicine

## 2021-03-17 DIAGNOSIS — I10 Essential (primary) hypertension: Secondary | ICD-10-CM

## 2021-07-26 ENCOUNTER — Telehealth: Payer: Self-pay

## 2021-07-26 ENCOUNTER — Other Ambulatory Visit: Payer: Self-pay

## 2021-07-26 DIAGNOSIS — I10 Essential (primary) hypertension: Secondary | ICD-10-CM

## 2021-07-26 MED ORDER — CARVEDILOL 6.25 MG PO TABS: ORAL_TABLET | ORAL | 0 refills | Status: DC

## 2021-07-26 NOTE — Telephone Encounter (Signed)
LMOM for pt to make an appt for next refill.

## 2021-08-04 IMAGING — CR DG CHEST 2V
1 series · 2 of 2 positions shown · non-contrast
Comparison: December 02, 2013

CLINICAL DATA: Cough

EXAM:
CHEST - 2 VIEW

[Series 1: dg chest 2 view · 0.14mm/px · 2 of 2 slices shown]
[im 1/2]
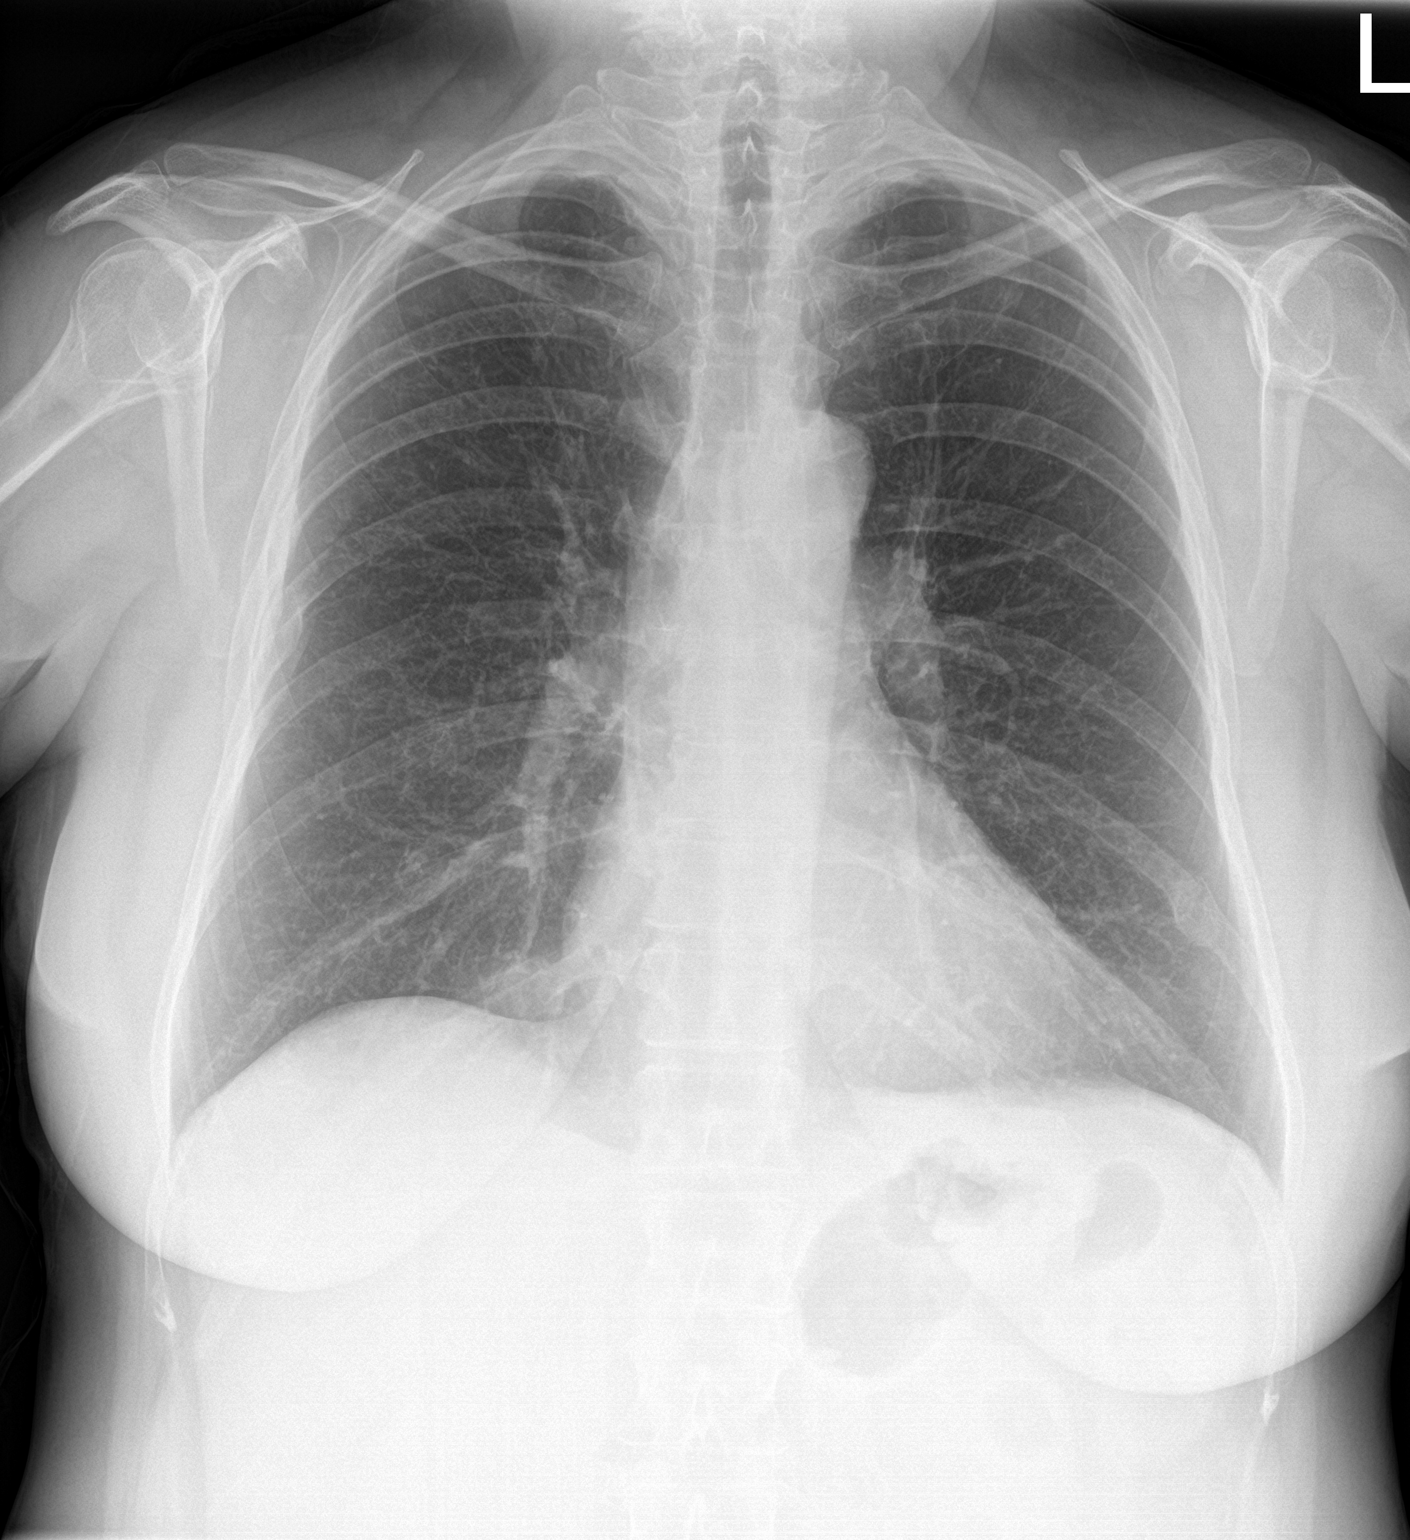
[im 2/2]
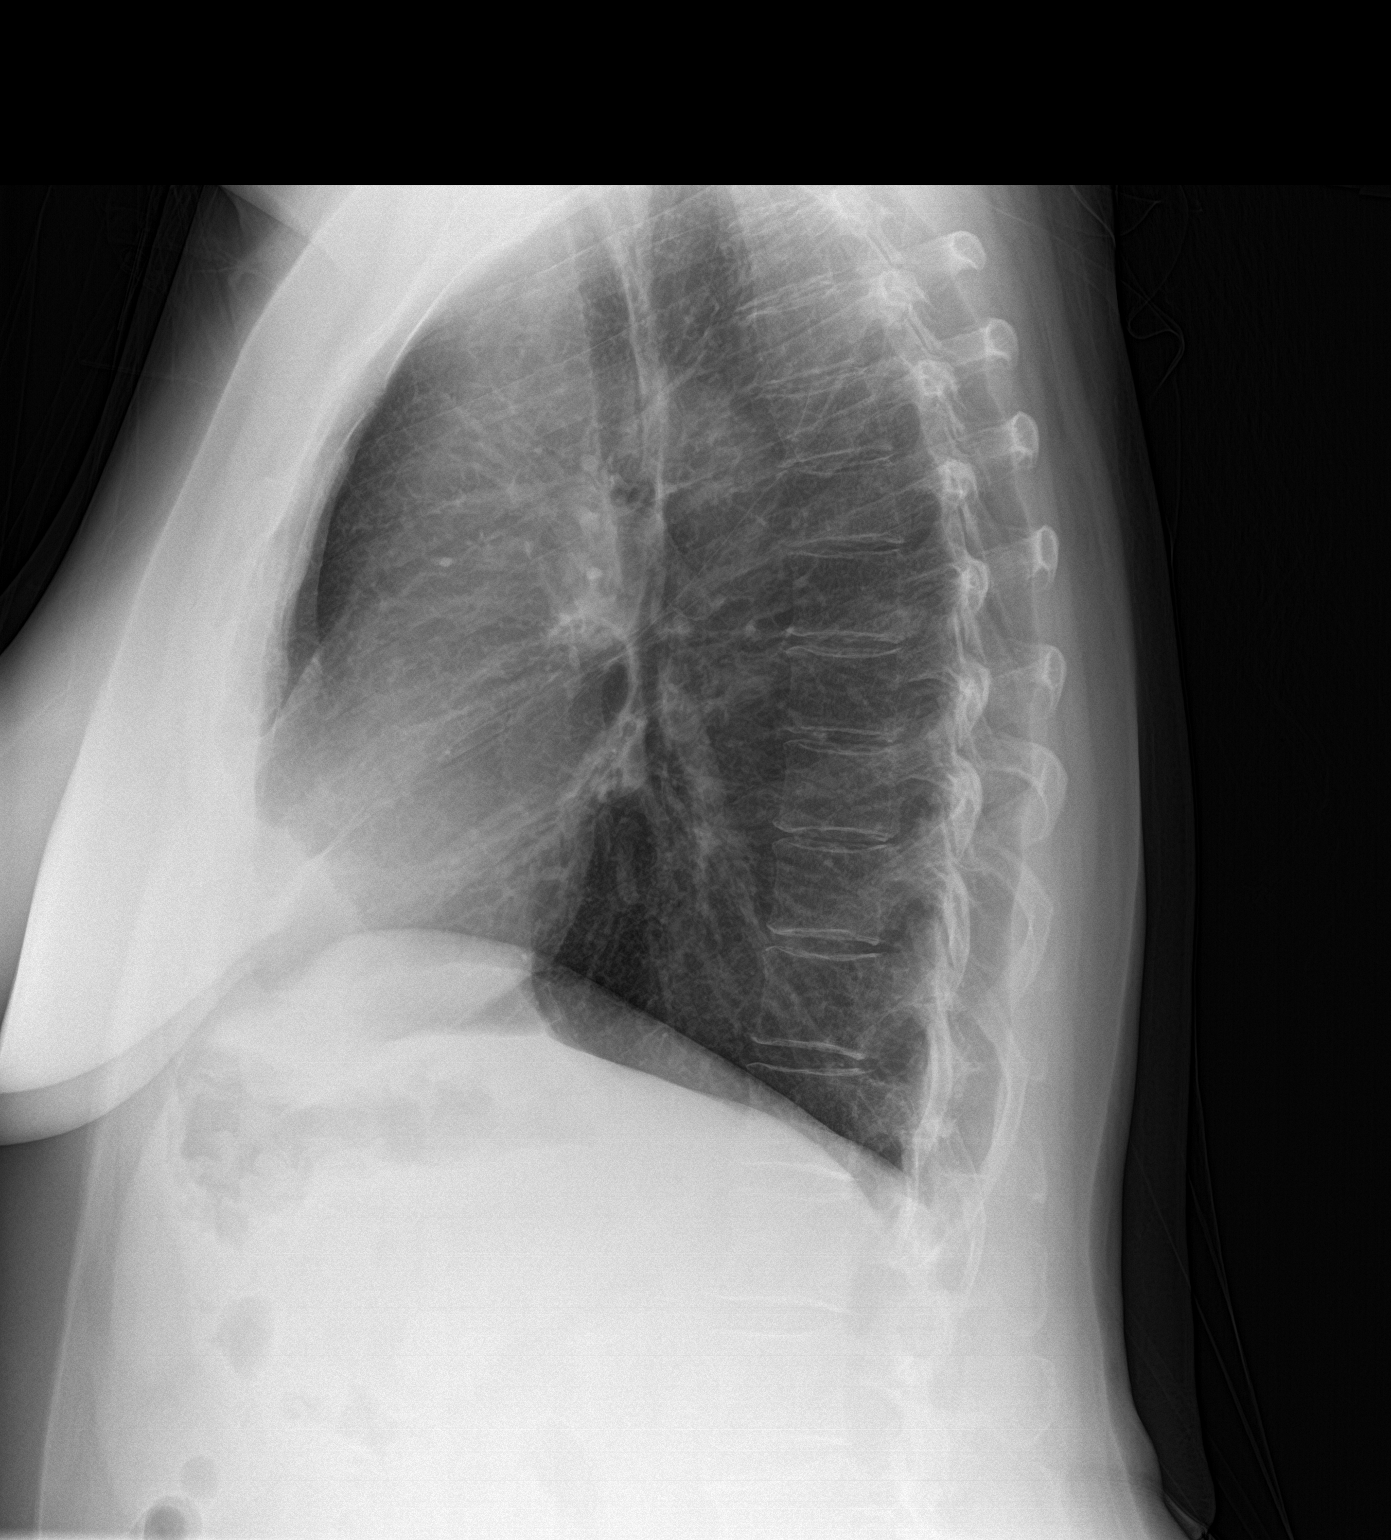

[2 of 2 positions shown; findings below may reference images not displayed]

FINDINGS: The cardiomediastinal silhouette is unchanged in contour. No pleural
effusion. No pneumothorax. No acute pleuroparenchymal abnormality.
Visualized abdomen is unremarkable. Remote bilateral rib fractures.
IMPRESSION: No acute cardiopulmonary abnormality.

## 2021-08-21 ENCOUNTER — Encounter: Payer: Self-pay | Admitting: Nurse Practitioner

## 2021-08-21 ENCOUNTER — Ambulatory Visit: Payer: Medicaid Other | Admitting: Nurse Practitioner

## 2021-08-21 ENCOUNTER — Other Ambulatory Visit: Payer: Self-pay

## 2021-08-21 VITALS — BP 138/79 | HR 90 | Temp 98.9°F | Resp 16 | Ht 63.0 in | Wt 150.6 lb

## 2021-08-21 DIAGNOSIS — M5030 Other cervical disc degeneration, unspecified cervical region: Secondary | ICD-10-CM

## 2021-08-21 DIAGNOSIS — L700 Acne vulgaris: Secondary | ICD-10-CM

## 2021-08-21 DIAGNOSIS — M064 Inflammatory polyarthropathy: Secondary | ICD-10-CM | POA: Diagnosis not present

## 2021-08-21 DIAGNOSIS — K219 Gastro-esophageal reflux disease without esophagitis: Secondary | ICD-10-CM

## 2021-08-21 DIAGNOSIS — R0602 Shortness of breath: Secondary | ICD-10-CM | POA: Diagnosis not present

## 2021-08-21 DIAGNOSIS — L0211 Cutaneous abscess of neck: Secondary | ICD-10-CM

## 2021-08-21 DIAGNOSIS — J449 Chronic obstructive pulmonary disease, unspecified: Secondary | ICD-10-CM | POA: Diagnosis not present

## 2021-08-21 DIAGNOSIS — I10 Essential (primary) hypertension: Secondary | ICD-10-CM

## 2021-08-21 MED ORDER — BUDESONIDE-FORMOTEROL FUMARATE 160-4.5 MCG/ACT IN AERO: 2.0000 | INHALATION_SPRAY | Freq: Two times a day (BID) | RESPIRATORY_TRACT | 3 refills | Status: DC

## 2021-08-21 MED ORDER — PREDNISONE 10 MG (21) PO TBPK: ORAL_TABLET | ORAL | 0 refills | Status: DC

## 2021-08-21 MED ORDER — CARVEDILOL 6.25 MG PO TABS: ORAL_TABLET | ORAL | 1 refills | Status: DC

## 2021-08-21 MED ORDER — PANTOPRAZOLE SODIUM 40 MG PO TBEC: 40.0000 mg | DELAYED_RELEASE_TABLET | Freq: Every day | ORAL | 3 refills | Status: DC

## 2021-08-21 MED ORDER — DICLOFENAC SODIUM 1 % EX GEL: 4.0000 g | Freq: Four times a day (QID) | CUTANEOUS | 2 refills | Status: DC

## 2021-08-21 MED ORDER — METHOCARBAMOL 500 MG PO TABS: 500.0000 mg | ORAL_TABLET | Freq: Three times a day (TID) | ORAL | 0 refills | Status: DC

## 2021-08-21 MED ORDER — OMEPRAZOLE 40 MG PO CPDR: 40.0000 mg | DELAYED_RELEASE_CAPSULE | Freq: Every day | ORAL | 3 refills | Status: DC

## 2021-08-21 MED ORDER — TETANUS-DIPHTH-ACELL PERTUSSIS 5-2.5-18.5 LF-MCG/0.5 IM SUSP: 0.5000 mL | Freq: Once | INTRAMUSCULAR | 0 refills | Status: AC

## 2021-08-21 MED ORDER — PROAIR HFA 108 (90 BASE) MCG/ACT IN AERS: INHALATION_SPRAY | RESPIRATORY_TRACT | 1 refills | Status: DC

## 2021-08-21 MED ORDER — AMLODIPINE BESYLATE 10 MG PO TABS: 10.0000 mg | ORAL_TABLET | Freq: Every day | ORAL | 3 refills | Status: DC

## 2021-08-21 MED ORDER — CLINDAMYCIN PHOSPHATE 1 % EX SOLN: 1.0000 "application " | Freq: Two times a day (BID) | CUTANEOUS | 2 refills | Status: DC

## 2021-08-21 NOTE — Progress Notes (Signed)
[facility] [address] [place], [place] [address]  Internal MEDICINE  Office Visit Note  Patient Name: Melody Moss  [id number]  [id number]  Date of Service: 08/21/2021  Chief Complaint  Patient presents with   Follow-up    Refills, discuss meds, discuss physical therapy, SOB   Depression   Hypertension   Anxiety    HPI Braniya presents for a follow up visit for chronic neck and back pain, and medication review.  She was previously doing physical therapy at [facility] clinic and would like to restart physical therapy. She also has been having an issue with shortness of breath.  Her blood pressure is well controlled with current medications. She is also in need of multiple medication refills.    Current Medication: Outpatient Encounter Medications as of 08/21/2021  Medication Sig   buPROPion (WELLBUTRIN XL) 150 MG 24 hr tablet TK 1 T PO QAM   carbamazepine (CARBATROL) 300 MG 12 hr capsule TK 2 [place] PO QHS   cloNIDine (CATAPRES) 0.1 MG tablet TAKE 1 TABLET BY MOUTH TWICE DAILY FOR ELEVATED BLOOD PRESSURE MORE THAN 160   dicyclomine (BENTYL) 20 MG tablet Take by mouth.   LATUDA 60 MG TABS TK 1 T PO WITH DINNER   levothyroxine (SYNTHROID) 50 MCG tablet Take 1 tablet (50 mcg total) by mouth daily. On empty stomach   lidocaine (LIDODERM) 5 % Place 1 patch onto the skin daily. Remove & Discard patch within 12 hours or as directed by MD   methocarbamol (ROBAXIN) 500 MG tablet Take 1 tablet (500 mg total) by mouth 3 (three) times daily.   NUEDEXTA 20-10 MG CAPS TK ONE C PO BID FOR PSEUDOBULBAR EFFECT   predniSONE (STERAPRED UNI-PAK 21 TAB) 10 MG (21) TBPK tablet Use as directed for 6 days   rosuvastatin (CRESTOR) 5 MG tablet TAKE 1 TABLET(5 MG) BY MOUTH DAILY   [DISCONTINUED] amLODipine (NORVASC) 10 MG tablet Take 1 tablet (10 mg total) by mouth daily.   [DISCONTINUED] budesonide-formoterol (SYMBICORT) 160-4.5 MCG/ACT inhaler Inhale 2 puffs into the lungs 2 (two) times daily.    [DISCONTINUED] carvedilol (COREG) 6.25 MG tablet TAKE 1 TABLET(6.25 MG) BY MOUTH TWICE DAILY WITH A MEAL   [DISCONTINUED] clindamycin (CLEOCIN T) 1 % external solution Apply 1 application topically 2 (two) times daily.   [DISCONTINUED] clotrimazole-betamethasone (LOTRISONE) cream Apply 1 application topically 2 (two) times daily.   [DISCONTINUED] diclofenac Sodium (VOLTAREN) 1 % GEL Apply 4 g topically 4 (four) times daily.   [DISCONTINUED] omeprazole (PRILOSEC) 40 MG capsule Take 1 capsule (40 mg total) by mouth daily.   [DISCONTINUED] pantoprazole (PROTONIX) 40 MG tablet Take 1 tablet (40 mg total) by mouth daily.   [DISCONTINUED] PROAIR HFA 108 (90 Base) MCG/ACT inhaler Inhale 2 puffs by mouth every 4-6 hours as needed for wheezing and cough.   [DISCONTINUED] Tdap (BOOSTRIX) 5-2.5-18.5 LF-MCG/0.5 injection Inject 0.5 mLs into the muscle once.   amLODipine (NORVASC) 10 MG tablet Take 1 tablet (10 mg total) by mouth daily.   budesonide-formoterol (SYMBICORT) 160-4.5 MCG/ACT inhaler Inhale 2 puffs into the lungs 2 (two) times daily.   carvedilol (COREG) 6.25 MG tablet TAKE 1 TABLET(6.25 MG) BY MOUTH TWICE DAILY WITH A MEAL   clindamycin (CLEOCIN T) 1 % external solution Apply 1 application topically 2 (two) times daily.   diclofenac Sodium (VOLTAREN) 1 % GEL Apply 4 g topically 4 (four) times daily.   pantoprazole (PROTONIX) 40 MG tablet Take 1 tablet (40 mg total) by mouth daily.   [  EXPIRED] Tdap (BOOSTRIX) 5-2.5-18.5 LF-MCG/0.5 injection Inject 0.5 mLs into the muscle once for 1 dose.   [DISCONTINUED] omeprazole (PRILOSEC) 40 MG capsule Take 1 capsule (40 mg total) by mouth daily.   [DISCONTINUED] PROAIR HFA 108 (90 Base) MCG/ACT inhaler Inhale 2 puffs by mouth every 4-6 hours as needed for wheezing and cough.   No facility-administered encounter medications on file as of 08/21/2021.    Surgical History: Past Surgical History:  Procedure Laterality Date   COSMETIC SURGERY     LAPAROSCOPIC  OOPHORECTOMY     few  times   TONSILLECTOMY Bilateral     Medical History: Past Medical History:  Diagnosis Date   Bipolar 1 disorder (HCC)    Depression    Hypertension    Panic attack     Family History: Family History  Adopted: Yes    Social History   Socioeconomic History   Marital status: Single    Spouse name: Not on file   Number of children: Not on file   Years of education: Not on file   Highest education level: Not on file  Occupational History   Not on file  Tobacco Use   Smoking status: Every Day    Packs/day: 0.50    Types: Cigarettes   Smokeless tobacco: Never  Substance and Sexual Activity   Alcohol use: Yes    Comment: ocassionally    Drug use: No   Sexual activity: Not on file  Other Topics Concern   Not on file  Social History Narrative   Not on file   Social Determinants of Health   Financial Resource Strain: Not on file  Food Insecurity: Not on file  Transportation Needs: Not on file  Physical Activity: Not on file  Stress: Not on file  Social Connections: Not on file  Intimate Partner Violence: Not on file      Review of Systems  Constitutional:  Negative for chills, fatigue, fever and unexpected weight change.  HENT:  Negative for congestion, rhinorrhea, sneezing and sore throat.   Eyes:  Negative for redness.  Respiratory:  Negative for cough, chest tightness and shortness of breath.   Cardiovascular:  Negative for chest pain and palpitations.  Gastrointestinal:  Negative for abdominal pain, constipation, diarrhea, nausea and vomiting.  Genitourinary:  Negative for dysuria and frequency.  Musculoskeletal:  Positive for arthralgias, back pain, neck pain and neck stiffness. Negative for joint swelling.  Skin: Negative.  Negative for rash.  Neurological: Negative.  Negative for dizziness, tremors, light-headedness, numbness and headaches.  Hematological:  Negative for adenopathy. Does not bruise/bleed easily.   Psychiatric/Behavioral:  Positive for behavioral problems (Depression). Negative for self-injury, sleep disturbance and suicidal ideas. The patient is nervous/anxious.    Vital Signs: BP 138/79   Pulse 90   Temp 98.9 F (37.2 C)   Resp 16   Ht 5\' 3"  (1.6 m)   Wt 150 lb 9.6 oz (68.3 kg)   SpO2 97%   BMI 26.68 kg/m    Physical Exam Vitals reviewed.  Constitutional:      General: She is not in acute distress.    Appearance: Normal appearance. She is normal weight. She is not ill-appearing.  HENT:     Head: Normocephalic and atraumatic.  Eyes:     Extraocular Movements: Extraocular movements intact.     Pupils: Pupils are equal, round, and reactive to light.  Neck:     Thyroid: No thyromegaly.     Trachea: Trachea and phonation normal.  Cardiovascular:     Rate and Rhythm: Normal rate and regular rhythm.     Pulses: Normal pulses.     Heart sounds: Normal heart sounds. No murmur heard. Pulmonary:     Effort: Pulmonary effort is normal. No respiratory distress.     Breath sounds: Normal breath sounds. No wheezing.  Musculoskeletal:     Cervical back: No edema, erythema, signs of trauma or rigidity. Pain with movement present. No spinous process tenderness or muscular tenderness. Decreased range of motion.     Lumbar back: Spasms present. No swelling, edema, deformity, signs of trauma or lacerations. No scoliosis.  Lymphadenopathy:     Cervical: No cervical adenopathy.  Neurological:     Mental Status: She is alert and oriented to person, place, and time.     Cranial Nerves: No cranial nerve deficit.     Coordination: Coordination normal.     Gait: Gait normal.  Psychiatric:        Mood and Affect: Mood normal.        Behavior: Behavior normal.       Assessment/Plan: 1. Chronic obstructive pulmonary disease, unspecified COPD type (HCC) Symbicort refill ordered. No PFT on record, PFT ordered.  - budesonide-formoterol (SYMBICORT) 160-4.5 MCG/ACT inhaler; Inhale 2  puffs into the lungs 2 (two) times daily.  Dispense: 1 each; Refill: 3 -Pulmonary function test  2. SOB (shortness of breath) PFT ordered. Rescue inhaler reordered.  -Pulmonary function test  3. Inflammatory polyarthritis (HCC) Voltaren gel refill ordered. And physical therapy referral ordered.  - diclofenac Sodium (VOLTAREN) 1 % GEL; Apply 4 g topically 4 (four) times daily.  Dispense: 100 g; Refill: 2 - Ambulatory referral to Orthopedic surgery  4. DDD (degenerative disc disease), cervical Physical therapy referral ordered - Ambulatory referral to Orthopedic surgery  5. Essential hypertension, malignant Blood pressure is stable with current medication - amLODipine (NORVASC) 10 MG tablet; Take 1 tablet (10 mg total) by mouth daily.  Dispense: 90 tablet; Refill: 3 - carvedilol (COREG) 6.25 MG tablet; TAKE 1 TABLET(6.25 MG) BY MOUTH TWICE DAILY WITH A MEAL  Dispense: 180 tablet; Refill: 1  6. Acne Vulgaris Refill ordered.  - clindamycin (CLEOCIN T) 1 % external solution; Apply 1 application topically 2 (two) times daily.  Dispense: 60 mL; Refill: 2  7. Gastroesophageal reflux disease without esophagitis Stable with current medication. - pantoprazole (PROTONIX) 40 MG tablet; Take 1 tablet (40 mg total) by mouth daily.  Dispense: 90 tablet; Refill: 3   General Counseling: Madaline verbalizes understanding of the findings of todays visit and agrees with plan of treatment. I have discussed any further diagnostic evaluation that may be needed or ordered today. We also reviewed her medications today. she has been encouraged to call the office with any questions or concerns that should arise related to todays visit.    Orders Placed This Encounter  Procedures   Ambulatory referral to Orthopedic Surgery   Pulmonary function test    Meds ordered this encounter  Medications   Tdap (BOOSTRIX) 5-2.5-18.5 LF-MCG/0.5 injection    Sig: Inject 0.5 mLs into the muscle once for 1 dose.     Dispense:  0.5 mL    Refill:  0   clindamycin (CLEOCIN T) 1 % external solution    Sig: Apply 1 application topically 2 (two) times daily.    Dispense:  60 mL    Refill:  2   budesonide-formoterol (SYMBICORT) 160-4.5 MCG/ACT inhaler    Sig: Inhale 2 puffs into the lungs  2 (two) times daily.    Dispense:  1 each    Refill:  3   DISCONTD: PROAIR HFA 108 (90 Base) MCG/ACT inhaler    Sig: Inhale 2 puffs by mouth every 4-6 hours as needed for wheezing and cough.    Dispense:  18 g    Refill:  1   diclofenac Sodium (VOLTAREN) 1 % GEL    Sig: Apply 4 g topically 4 (four) times daily.    Dispense:  100 g    Refill:  2   amLODipine (NORVASC) 10 MG tablet    Sig: Take 1 tablet (10 mg total) by mouth daily.    Dispense:  90 tablet    Refill:  3   DISCONTD: omeprazole (PRILOSEC) 40 MG capsule    Sig: Take 1 capsule (40 mg total) by mouth daily.    Dispense:  90 capsule    Refill:  3   pantoprazole (PROTONIX) 40 MG tablet    Sig: Take 1 tablet (40 mg total) by mouth daily.    Dispense:  90 tablet    Refill:  3   carvedilol (COREG) 6.25 MG tablet    Sig: TAKE 1 TABLET(6.25 MG) BY MOUTH TWICE DAILY WITH A MEAL    Dispense:  180 tablet    Refill:  1    Pt needs appt for next refill   methocarbamol (ROBAXIN) 500 MG tablet    Sig: Take 1 tablet (500 mg total) by mouth 3 (three) times daily.    Dispense:  60 tablet    Refill:  0   predniSONE (STERAPRED UNI-PAK 21 TAB) 10 MG (21) TBPK tablet    Sig: Use as directed for 6 days    Dispense:  21 tablet    Refill:  0    Return in about 6 months (around 02/19/2022) for F/U, med refill, Jaevon Paras PCP.   Total time spent:30 Minutes Time spent includes review of chart, medications, test results, and follow up plan with the patient.   [place] Controlled Substance Database was reviewed by me.  This patient was seen by Sallyanne Kuster, FNP-C in collaboration with Dr. Beverely Risen as a part of collaborative care agreement.   Hailey Stormer R. Tedd Sias, MSN,  FNP-C Internal medicine

## 2021-08-22 ENCOUNTER — Telehealth: Payer: Self-pay

## 2021-08-22 ENCOUNTER — Other Ambulatory Visit: Payer: Self-pay

## 2021-08-22 MED ORDER — ALBUTEROL SULFATE HFA 108 (90 BASE) MCG/ACT IN AERS: 2.0000 | INHALATION_SPRAY | RESPIRATORY_TRACT | 3 refills | Status: DC | PRN

## 2021-08-22 NOTE — Telephone Encounter (Signed)
Patient called requesting updated orthopedic referral. I explained to her since previous referral was from over a year ago and is no longer showing in her chart, I will request new order from [facility]

## 2021-08-22 NOTE — Telephone Encounter (Signed)
As per phar pro air is no longer available as per alyssa send albuterol inhaler  and also pt aware that we change inhaler due to manufacture no longer available

## 2021-09-07 ENCOUNTER — Telehealth: Payer: Self-pay

## 2021-09-07 NOTE — Telephone Encounter (Signed)
Pt called back upset about referral  that nobody has been in contact with her yet and I explained that it can take 2-4 week turn a round and that if she was in that much pain she should go to walk in at [facility].  Pt requesting refill on prednisone to help with her pain, and advised we will get back with her

## 2021-09-07 NOTE — Telephone Encounter (Signed)
Patient called back today. I sent message to dfk to see if she can assist me with this.

## 2021-09-10 NOTE — Telephone Encounter (Signed)
PT referral put in for patient. Per patient, she needs orthopedic referral at [facility]. Alyssa will do referral today-Toni

## 2021-09-10 NOTE — Telephone Encounter (Signed)
Orthopedic referral sent via [facility] to [facility] per patient's request-Toni

## 2021-09-17 NOTE — Telephone Encounter (Signed)
Patient scheduled 09/21/21 @ 3:15 w/ Cecilio Asper

## 2021-09-24 ENCOUNTER — Other Ambulatory Visit (HOSPITAL_COMMUNITY): Payer: Self-pay | Admitting: Orthopedic Surgery

## 2021-09-24 ENCOUNTER — Other Ambulatory Visit: Payer: Self-pay | Admitting: Orthopedic Surgery

## 2021-09-24 DIAGNOSIS — M5412 Radiculopathy, cervical region: Secondary | ICD-10-CM

## 2021-09-24 DIAGNOSIS — M4802 Spinal stenosis, cervical region: Secondary | ICD-10-CM

## 2021-09-24 DIAGNOSIS — M5030 Other cervical disc degeneration, unspecified cervical region: Secondary | ICD-10-CM

## 2021-09-28 ENCOUNTER — Telehealth: Payer: Self-pay

## 2021-09-28 ENCOUNTER — Other Ambulatory Visit: Payer: Self-pay | Admitting: Internal Medicine

## 2021-09-28 DIAGNOSIS — E039 Hypothyroidism, unspecified: Secondary | ICD-10-CM

## 2021-09-28 NOTE — Telephone Encounter (Signed)
LMOM to see if pt wants to pick up her records or if she wants them sent somewhere we need a fax number

## 2021-09-28 NOTE — Telephone Encounter (Signed)
Completed medical records for [facility] Faxed to [phone]  Phone [phone] ext. [phone]

## 2021-10-02 ENCOUNTER — Other Ambulatory Visit: Payer: Self-pay

## 2021-10-02 ENCOUNTER — Ambulatory Visit
Admission: RE | Admit: 2021-10-02 | Discharge: 2021-10-02 | Disposition: A | Discharge status: Home / Self Care | Payer: Medicaid Other | Source: Ambulatory Visit | Attending: Orthopedic Surgery | Admitting: Orthopedic Surgery

## 2021-10-02 DIAGNOSIS — M5412 Radiculopathy, cervical region: Secondary | ICD-10-CM | POA: Diagnosis present

## 2021-10-02 DIAGNOSIS — M5030 Other cervical disc degeneration, unspecified cervical region: Secondary | ICD-10-CM | POA: Insufficient documentation

## 2021-10-02 DIAGNOSIS — M4802 Spinal stenosis, cervical region: Secondary | ICD-10-CM | POA: Diagnosis present

## 2021-11-08 ENCOUNTER — Other Ambulatory Visit: Payer: Self-pay | Admitting: Physical Medicine & Rehabilitation

## 2021-11-08 DIAGNOSIS — M546 Pain in thoracic spine: Secondary | ICD-10-CM

## 2021-11-26 ENCOUNTER — Other Ambulatory Visit: Payer: Medicaid Other

## 2021-12-03 ENCOUNTER — Ambulatory Visit
Admission: RE | Admit: 2021-12-03 | Discharge: 2021-12-03 | Disposition: A | Discharge status: Home / Self Care | Payer: Medicaid Other | Source: Ambulatory Visit | Attending: Physical Medicine & Rehabilitation | Admitting: Physical Medicine & Rehabilitation

## 2021-12-03 DIAGNOSIS — M546 Pain in thoracic spine: Secondary | ICD-10-CM | POA: Insufficient documentation

## 2021-12-03 DIAGNOSIS — G8929 Other chronic pain: Secondary | ICD-10-CM | POA: Insufficient documentation

## 2021-12-07 ENCOUNTER — Other Ambulatory Visit: Payer: Self-pay | Admitting: Internal Medicine

## 2021-12-07 DIAGNOSIS — E782 Mixed hyperlipidemia: Secondary | ICD-10-CM

## 2021-12-15 ENCOUNTER — Other Ambulatory Visit: Payer: Self-pay | Admitting: Nurse Practitioner

## 2021-12-15 DIAGNOSIS — J449 Chronic obstructive pulmonary disease, unspecified: Secondary | ICD-10-CM

## 2022-01-07 ENCOUNTER — Telehealth: Payer: Self-pay

## 2022-01-07 NOTE — Telephone Encounter (Signed)
Pt called requesting refill for her levothyroxine.  I informed pt that we sent a 90 day rx with 3 refills on 09/28/2021.  Advised to call pharmacy and let them know we sent rx then.

## 2022-01-14 ENCOUNTER — Other Ambulatory Visit: Payer: Self-pay

## 2022-01-14 DIAGNOSIS — J449 Chronic obstructive pulmonary disease, unspecified: Secondary | ICD-10-CM

## 2022-01-14 MED ORDER — BUDESONIDE-FORMOTEROL FUMARATE 160-4.5 MCG/ACT IN AERO: 2.0000 | INHALATION_SPRAY | Freq: Two times a day (BID) | RESPIRATORY_TRACT | 3 refills | Status: DC

## 2022-01-24 NOTE — Telephone Encounter (Signed)
error 

## 2022-01-29 ENCOUNTER — Other Ambulatory Visit: Payer: Self-pay

## 2022-01-29 DIAGNOSIS — I10 Essential (primary) hypertension: Secondary | ICD-10-CM

## 2022-01-29 MED ORDER — CARVEDILOL 6.25 MG PO TABS: ORAL_TABLET | ORAL | 0 refills | Status: DC

## 2022-02-09 ENCOUNTER — Other Ambulatory Visit: Payer: Self-pay | Admitting: Nurse Practitioner

## 2022-02-09 DIAGNOSIS — E782 Mixed hyperlipidemia: Secondary | ICD-10-CM

## 2022-02-19 ENCOUNTER — Encounter: Payer: Self-pay | Admitting: Nurse Practitioner

## 2022-02-19 ENCOUNTER — Ambulatory Visit: Payer: Medicaid Other | Admitting: Nurse Practitioner

## 2022-02-19 ENCOUNTER — Encounter: Payer: Medicaid Other | Admitting: Nurse Practitioner

## 2022-02-19 VITALS — BP 135/90 | HR 87 | Temp 97.8°F | Resp 16 | Ht 63.0 in | Wt 144.0 lb

## 2022-02-19 DIAGNOSIS — E782 Mixed hyperlipidemia: Secondary | ICD-10-CM | POA: Diagnosis not present

## 2022-02-19 DIAGNOSIS — J449 Chronic obstructive pulmonary disease, unspecified: Secondary | ICD-10-CM

## 2022-02-19 DIAGNOSIS — Z1231 Encounter for screening mammogram for malignant neoplasm of breast: Secondary | ICD-10-CM

## 2022-02-19 DIAGNOSIS — E039 Hypothyroidism, unspecified: Secondary | ICD-10-CM | POA: Diagnosis not present

## 2022-02-19 DIAGNOSIS — R300 Dysuria: Secondary | ICD-10-CM

## 2022-02-19 DIAGNOSIS — E538 Deficiency of other specified B group vitamins: Secondary | ICD-10-CM

## 2022-02-19 DIAGNOSIS — Z0001 Encounter for general adult medical examination with abnormal findings: Secondary | ICD-10-CM | POA: Diagnosis not present

## 2022-02-19 DIAGNOSIS — I10 Essential (primary) hypertension: Secondary | ICD-10-CM | POA: Diagnosis not present

## 2022-02-19 DIAGNOSIS — Z1212 Encounter for screening for malignant neoplasm of rectum: Secondary | ICD-10-CM

## 2022-02-19 DIAGNOSIS — E559 Vitamin D deficiency, unspecified: Secondary | ICD-10-CM

## 2022-02-19 DIAGNOSIS — Z1211 Encounter for screening for malignant neoplasm of colon: Secondary | ICD-10-CM

## 2022-02-19 MED ORDER — ALBUTEROL SULFATE HFA 108 (90 BASE) MCG/ACT IN AERS: 2.0000 | INHALATION_SPRAY | Freq: Four times a day (QID) | RESPIRATORY_TRACT | 2 refills | Status: DC | PRN

## 2022-02-19 MED ORDER — ROSUVASTATIN CALCIUM 5 MG PO TABS: 5.0000 mg | ORAL_TABLET | Freq: Every day | ORAL | 3 refills | Status: DC

## 2022-02-19 NOTE — Progress Notes (Signed)
[facility] ?[address] ?[place], [place] [address] ? ?Internal MEDICINE  ?Office Visit Note ? ?Patient Name: Melody Moss ? [id number]  ?[id number] ? ?Date of Service: 02/19/2022 ? ?Chief Complaint  ?Patient presents with  ? Annual Exam  ? Anxiety  ? Depression  ? Hypertension  ? ? ?HPI ?Melody Moss presents for an annual well visit and physical exam.  She is a well-appearing 61 year old female with hypertension, migraines, asthma, GERD, hypothyroidism, generalized anxiety disorder, hyperlipidemia, and she is a current smoker.  She is due for colorectal cancer screening and has opted to do the Cologuard test.  She is due for a CT chest for lung cancer screening.  She is overdue for a routine screening mammogram and she does want a clinical breast exam today.  She is also due for routine labs.  Her husband has accompanied her to her office visit today and the patient acknowledges that it is okay for him to be present during her office visit and physical exam today.  Her vital signs and blood pressure are stable. ? ? ? ?Current Medication: ?Outpatient Encounter Medications as of 02/19/2022  ?Medication Sig  ? albuterol (VENTOLIN HFA) 108 (90 Base) MCG/ACT inhaler Inhale 2 puffs into the lungs every 6 (six) hours as needed for wheezing or shortness of breath.  ? amLODipine (NORVASC) 10 MG tablet Take 1 tablet (10 mg total) by mouth daily.  ? budesonide-formoterol (SYMBICORT) 160-4.5 MCG/ACT inhaler Inhale 2 puffs into the lungs 2 (two) times daily.  ? buPROPion (WELLBUTRIN XL) 150 MG 24 hr tablet TK 1 T PO QAM  ? carbamazepine (CARBATROL) 300 MG 12 hr capsule TK 2 [place] PO QHS  ? carvedilol (COREG) 6.25 MG tablet TAKE 1 TABLET(6.25 MG) BY MOUTH TWICE DAILY WITH A MEAL  ? clindamycin (CLEOCIN T) 1 % external solution Apply 1 application topically 2 (two) times daily.  ? cloNIDine (CATAPRES) 0.1 MG tablet TAKE 1 TABLET BY MOUTH TWICE DAILY FOR ELEVATED BLOOD PRESSURE MORE THAN 160  ? diclofenac Sodium (VOLTAREN) 1 % GEL  Apply 4 g topically 4 (four) times daily.  ? dicyclomine (BENTYL) 20 MG tablet Take by mouth.  ? LATUDA 60 MG TABS TK 1 T PO WITH DINNER  ? levothyroxine (SYNTHROID) 50 MCG tablet TAKE 1 TABLET(50 MCG) BY MOUTH DAILY ON AN EMPTY STOMACH  ? lidocaine (LIDODERM) 5 % Place 1 patch onto the skin daily. Remove & Discard patch within 12 hours or as directed by MD  ? NUEDEXTA 20-10 MG CAPS TK ONE C PO BID FOR PSEUDOBULBAR EFFECT  ? pantoprazole (PROTONIX) 40 MG tablet Take 1 tablet (40 mg total) by mouth daily.  ? predniSONE (STERAPRED UNI-PAK 21 TAB) 10 MG (21) TBPK tablet Use as directed for 6 days  ? [DISCONTINUED] albuterol (VENTOLIN HFA) 108 (90 Base) MCG/ACT inhaler Inhale 2 puffs into the lungs every 4 (four) hours as needed for wheezing or shortness of breath.  ? [DISCONTINUED] methocarbamol (ROBAXIN) 500 MG tablet Take 1 tablet (500 mg total) by mouth 3 (three) times daily.  ? [DISCONTINUED] rosuvastatin (CRESTOR) 5 MG tablet TAKE 1 TABLET BY MOUTH DAILY  ? rosuvastatin (CRESTOR) 5 MG tablet Take 1 tablet (5 mg total) by mouth daily.  ? ?No facility-administered encounter medications on file as of 02/19/2022.  ? ? ?Surgical History: ?Past Surgical History:  ?Procedure Laterality Date  ? COSMETIC SURGERY    ? LAPAROSCOPIC OOPHORECTOMY    ? few  times  ? TONSILLECTOMY Bilateral   ? ? ?  Medical History: ?Past Medical History:  ?Diagnosis Date  ? Bipolar 1 disorder ([place])   ? Depression   ? Hypertension   ? Panic attack   ? ? ?Family History: ?Family History  ?Adopted: Yes  ? ? ?Social History  ? ?Socioeconomic History  ? Marital status: Single  ?  Spouse name: Not on file  ? Number of children: Not on file  ? Years of education: Not on file  ? Highest education level: Not on file  ?Occupational History  ? Not on file  ?Tobacco Use  ? Smoking status: Every Day  ?  Packs/day: 0.50  ?  Types: Cigarettes  ? Smokeless tobacco: Never  ?Substance and Sexual Activity  ? Alcohol use: Yes  ?  Comment: ocassionally   ? Drug use: No   ? Sexual activity: Not on file  ?Other Topics Concern  ? Not on file  ?Social History Narrative  ? Not on file  ? ?Social Determinants of Health  ? ?Financial Resource Strain: Not on file  ?Food Insecurity: Not on file  ?Transportation Needs: Not on file  ?Physical Activity: Not on file  ?Stress: Not on file  ?Social Connections: Not on file  ?Intimate Partner Violence: Not on file  ? ? ? ? ?Review of Systems  ?Constitutional:  Negative for activity change, appetite change, chills, fatigue, fever and unexpected weight change.  ?HENT: Negative.  Negative for congestion, ear pain, rhinorrhea, sore throat and trouble swallowing.   ?Eyes: Negative.   ?Respiratory: Negative.  Negative for cough, chest tightness, shortness of breath and wheezing.   ?Cardiovascular: Negative.  Negative for chest pain.  ?Gastrointestinal: Negative.  Negative for abdominal pain, blood in stool, constipation, diarrhea, nausea and vomiting.  ?Endocrine: Negative.   ?Genitourinary: Negative.  Negative for difficulty urinating, dysuria, frequency, hematuria and urgency.  ?Musculoskeletal: Negative.  Negative for arthralgias, back pain, joint swelling, myalgias and neck pain.  ?Skin: Negative.  Negative for rash and wound.  ?Allergic/Immunologic: Negative.  Negative for immunocompromised state.  ?Neurological: Negative.  Negative for dizziness, seizures, numbness and headaches.  ?Hematological: Negative.   ?Psychiatric/Behavioral: Negative.  Negative for behavioral problems, self-injury and suicidal ideas. The patient is not nervous/anxious.   ? ?Vital Signs: ?BP 135/90   Pulse 87   Temp 97.8 ?F (36.6 ?C)   Resp 16   Ht 5' 3"  (1.6 m)   Wt 144 lb (65.3 kg)   SpO2 97%   BMI 25.51 kg/m?  ? ? ?Physical Exam ?Vitals reviewed.  ?Constitutional:   ?   General: She is not in acute distress. ?   Appearance: She is well-developed. She is not diaphoretic.  ?HENT:  ?   Head: Normocephalic and atraumatic.  ?   Right Ear: External ear normal.  ?   Left  Ear: External ear normal.  ?   Nose: Nose normal.  ?   Mouth/Throat:  ?   Pharynx: No oropharyngeal exudate.  ?Eyes:  ?   General: No scleral icterus.    ?   Right eye: No discharge.     ?   Left eye: No discharge.  ?   Conjunctiva/sclera: Conjunctivae normal.  ?   Pupils: Pupils are equal, round, and reactive to light.  ?Neck:  ?   Thyroid: No thyromegaly.  ?   Vascular: No JVD.  ?   Trachea: No tracheal deviation.  ?Cardiovascular:  ?   Rate and Rhythm: Normal rate and regular rhythm.  ?   Heart sounds: Normal heart  sounds. No murmur heard. ?  No friction rub. No gallop.  ?Pulmonary:  ?   Effort: Pulmonary effort is normal. No respiratory distress.  ?   Breath sounds: Normal breath sounds. No stridor. No wheezing or rales.  ?Chest:  ?   Chest wall: No tenderness.  ?Abdominal:  ?   General: Bowel sounds are normal. There is no distension.  ?   Palpations: Abdomen is soft. There is no mass.  ?   Tenderness: There is no abdominal tenderness. There is no guarding or rebound.  ?Musculoskeletal:     ?   General: No tenderness or deformity. Normal range of motion.  ?   Cervical back: Normal range of motion and neck supple.  ?Lymphadenopathy:  ?   Cervical: No cervical adenopathy.  ?Skin: ?   General: Skin is warm and dry.  ?   Coloration: Skin is not pale.  ?   Findings: No erythema or rash.  ?Neurological:  ?   Mental Status: She is alert.  ?   Cranial Nerves: No cranial nerve deficit.  ?   Motor: No abnormal muscle tone.  ?   Coordination: Coordination normal.  ?   Deep Tendon Reflexes: Reflexes are normal and symmetric.  ?Psychiatric:     ?   Behavior: Behavior normal.     ?   Thought Content: Thought content normal.     ?   Judgment: Judgment normal.  ? ? ? ? ? ?Assessment/Plan: ?1. Encounter for routine adult health examination with abnormal findings ?Age-appropriate preventive screenings and vaccinations discussed, annual physical exam completed. Routine labs for health maintenance ordered. See orders below. PHM  updated.  ? ?2. Essential hypertension ?Routine labs ordered ?- CBC with Differential/Platelet ?- CMP14+EGFR ?- Vitamin D (25 hydroxy) ? ?3. Mixed hyperlipidemia ?Rosuvastatin refills ordered. routine labs ord

## 2022-02-20 ENCOUNTER — Encounter: Payer: Self-pay | Admitting: Nurse Practitioner

## 2022-02-20 LAB — UA/M W/RFLX CULTURE, ROUTINE
Bilirubin, UA: NEGATIVE
Glucose, UA: NEGATIVE
Ketones, UA: NEGATIVE
Leukocytes,UA: NEGATIVE
Nitrite, UA: NEGATIVE
Protein,UA: NEGATIVE
RBC, UA: NEGATIVE
Specific Gravity, UA: 1.02 (ref 1.005–1.030)
Urobilinogen, Ur: 0.2 mg/dL (ref 0.2–1.0)
pH, UA: 5.5 (ref 5.0–7.5)

## 2022-02-20 LAB — MICROSCOPIC EXAMINATION
Bacteria, UA: NONE SEEN
Casts: NONE SEEN /lpf
RBC, Urine: NONE SEEN /hpf (ref 0–2)

## 2022-02-20 NOTE — Progress Notes (Signed)
error 

## 2022-03-16 LAB — COLOGUARD: COLOGUARD: NEGATIVE

## 2022-03-19 ENCOUNTER — Telehealth: Payer: Self-pay

## 2022-03-19 NOTE — Telephone Encounter (Signed)
-----   Message from Melody Kuster, NP sent at 03/19/2022  6:25 AM EDT ----- ?Please call patient and let her know that her cologuard test is negative, will repeat in 3 years.  ?

## 2022-03-19 NOTE — Progress Notes (Signed)
Please call patient and let her know that her cologuard test is negative, will repeat in 3 years.

## 2022-03-19 NOTE — Telephone Encounter (Signed)
Spoke to pt, provided results  

## 2022-04-26 ENCOUNTER — Other Ambulatory Visit: Payer: Self-pay | Admitting: Nurse Practitioner

## 2022-04-26 DIAGNOSIS — I10 Essential (primary) hypertension: Secondary | ICD-10-CM

## 2022-05-02 ENCOUNTER — Other Ambulatory Visit: Payer: Self-pay | Admitting: Nurse Practitioner

## 2022-05-02 DIAGNOSIS — J449 Chronic obstructive pulmonary disease, unspecified: Secondary | ICD-10-CM

## 2022-05-30 ENCOUNTER — Telehealth: Payer: Self-pay

## 2022-05-30 NOTE — Telephone Encounter (Signed)
Medical records request completed and requesting records mailed to [facility] [address] [place] [address].

## 2022-06-09 ENCOUNTER — Other Ambulatory Visit: Payer: Self-pay | Admitting: Nurse Practitioner

## 2022-06-09 DIAGNOSIS — J449 Chronic obstructive pulmonary disease, unspecified: Secondary | ICD-10-CM

## 2022-08-20 ENCOUNTER — Ambulatory Visit: Payer: Self-pay | Admitting: Nurse Practitioner

## 2022-08-20 ENCOUNTER — Encounter: Payer: Self-pay | Admitting: Nurse Practitioner

## 2022-08-20 VITALS — BP 134/78 | HR 98 | Temp 97.8°F | Resp 16 | Ht 63.0 in | Wt 136.0 lb

## 2022-08-20 DIAGNOSIS — I10 Essential (primary) hypertension: Secondary | ICD-10-CM | POA: Diagnosis not present

## 2022-08-20 DIAGNOSIS — M064 Inflammatory polyarthropathy: Secondary | ICD-10-CM | POA: Diagnosis not present

## 2022-08-20 DIAGNOSIS — L700 Acne vulgaris: Secondary | ICD-10-CM

## 2022-08-20 DIAGNOSIS — Z760 Encounter for issue of repeat prescription: Secondary | ICD-10-CM

## 2022-08-20 DIAGNOSIS — K219 Gastro-esophageal reflux disease without esophagitis: Secondary | ICD-10-CM

## 2022-08-20 DIAGNOSIS — J449 Chronic obstructive pulmonary disease, unspecified: Secondary | ICD-10-CM

## 2022-08-20 DIAGNOSIS — E039 Hypothyroidism, unspecified: Secondary | ICD-10-CM

## 2022-08-20 MED ORDER — DICLOFENAC SODIUM 1 % EX GEL: 4.0000 g | Freq: Four times a day (QID) | CUTANEOUS | 2 refills | Status: DC

## 2022-08-20 MED ORDER — DICLOFENAC SODIUM 75 MG PO TBEC: 75.0000 mg | DELAYED_RELEASE_TABLET | Freq: Two times a day (BID) | ORAL | 2 refills | Status: DC

## 2022-08-20 MED ORDER — LEVOTHYROXINE SODIUM 50 MCG PO TABS: ORAL_TABLET | ORAL | 3 refills | Status: DC

## 2022-08-20 MED ORDER — ALBUTEROL SULFATE HFA 108 (90 BASE) MCG/ACT IN AERS: 2.0000 | INHALATION_SPRAY | Freq: Four times a day (QID) | RESPIRATORY_TRACT | 3 refills | Status: DC | PRN

## 2022-08-20 MED ORDER — PANTOPRAZOLE SODIUM 40 MG PO TBEC
40.0000 mg | DELAYED_RELEASE_TABLET | Freq: Every day | ORAL | 3 refills | Status: DC
Start: 2022-08-20 — End: 2023-09-08

## 2022-08-20 MED ORDER — BUDESONIDE-FORMOTEROL FUMARATE 160-4.5 MCG/ACT IN AERO: 2.0000 | INHALATION_SPRAY | Freq: Two times a day (BID) | RESPIRATORY_TRACT | 3 refills | Status: DC

## 2022-08-20 MED ORDER — CARVEDILOL 6.25 MG PO TABS: ORAL_TABLET | ORAL | 0 refills | Status: DC

## 2022-08-20 MED ORDER — AMLODIPINE BESYLATE 10 MG PO TABS: 10.0000 mg | ORAL_TABLET | Freq: Every day | ORAL | 3 refills | Status: DC

## 2022-08-20 MED ORDER — CLINDAMYCIN PHOSPHATE 1 % EX SOLN: 1.0000 | Freq: Two times a day (BID) | CUTANEOUS | 2 refills | Status: DC

## 2022-08-20 NOTE — Progress Notes (Signed)
[facility] [address] 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  Other medication refills due    Current Medication: Outpatient Encounter Medications as of 08/20/2022  Medication Sig   buPROPion (WELLBUTRIN XL) 150 MG 24 hr tablet TK 1 T PO QAM   carbamazepine (CARBATROL) 300 MG 12 hr capsule TK 2 [place] PO QHS   cloNIDine (CATAPRES) 0.1 MG tablet TAKE 1 TABLET BY MOUTH TWICE DAILY FOR ELEVATED BLOOD PRESSURE MORE THAN 160   dicyclomine (BENTYL) 20 MG tablet Take by mouth.   LATUDA 60 MG TABS TK 1 T PO WITH DINNER   lidocaine (LIDODERM) 5 % Place 1 patch onto the skin daily. Remove & Discard patch within 12 hours or as directed by MD   NUEDEXTA 20-10 MG CAPS TK ONE C PO BID FOR PSEUDOBULBAR EFFECT   rosuvastatin (CRESTOR) 5 MG tablet Take 1 tablet (5 mg total) by mouth daily.   [DISCONTINUED] amLODipine (NORVASC) 10 MG tablet Take 1 tablet (10 mg total) by mouth daily.   [DISCONTINUED] carvedilol (COREG) 6.25 MG tablet TAKE 1 TABLET(6.25 MG) BY MOUTH TWICE DAILY WITH A MEAL   [DISCONTINUED] clindamycin (CLEOCIN T) 1 % external solution Apply 1 application topically 2 (two) times daily.   [DISCONTINUED] diclofenac (VOLTAREN) 75 MG EC tablet Take 1 tablet (75 mg total) by mouth 2 (two) times daily.   [DISCONTINUED] diclofenac Sodium (VOLTAREN) 1 % GEL Apply 4 g topically 4 (four) times daily.   [DISCONTINUED] levothyroxine (SYNTHROID) 50 MCG tablet TAKE 1 TABLET(50 MCG) BY MOUTH DAILY ON AN EMPTY STOMACH    [DISCONTINUED] pantoprazole (PROTONIX) 40 MG tablet Take 1 tablet (40 mg total) by mouth daily.   [DISCONTINUED] predniSONE (STERAPRED UNI-PAK 21 TAB) 10 MG (21) TBPK tablet Use as directed for 6 days   [DISCONTINUED] SYMBICORT 160-4.5 MCG/ACT inhaler INHALE 2 PUFFS INTO THE LUNGS TWICE DAILY   [DISCONTINUED] VENTOLIN HFA 108 (90 Base) MCG/ACT inhaler INHALE 2 PUFFS INTO THE LUNGS EVERY 6 HOURS AS NEEDED FOR WHEEZING OR SHORTNESS OF BREATH   albuterol (VENTOLIN HFA) 108 (90 Base) MCG/ACT inhaler Inhale 2 puffs into the lungs every 6 (six) hours as needed for wheezing or shortness of breath.   budesonide-formoterol (SYMBICORT) 160-4.5 MCG/ACT inhaler Inhale 2 puffs into the lungs 2 (two) times daily.   carvedilol (COREG) 6.25 MG tablet TAKE 1 TABLET(6.25 MG) BY MOUTH TWICE DAILY WITH A MEAL   clindamycin (CLEOCIN T) 1 % external solution Apply 1 Application topically 2 (two) times daily.   diclofenac Sodium (VOLTAREN) 1 % GEL Apply 4 g topically 4 (four) times daily.   levothyroxine (SYNTHROID) 50 MCG tablet TAKE 1 TABLET(50 MCG) BY MOUTH DAILY ON AN EMPTY STOMACH   pantoprazole (PROTONIX) 40 MG tablet Take 1 tablet (40 mg total) by mouth daily.   [DISCONTINUED] amLODipine (NORVASC) 10 MG tablet Take 1 tablet (10 mg total) by mouth daily.   No facility-administered encounter medications on file as of 08/20/2022.    Surgical History: Past Surgical History:  Procedure Laterality Date  COSMETIC SURGERY     LAPAROSCOPIC OOPHORECTOMY     few  times   TONSILLECTOMY Bilateral     Medical History: Past Medical History:  Diagnosis Date   Bipolar 1 disorder (HCC)    Depression    Hypertension    Panic attack     Family History: Family History  Adopted: Yes    Social History   Socioeconomic History   Marital status: Single    Spouse name: Not on file   Number of children: Not on file   Years of education: Not on file   Highest education level: Not on file  Occupational History    Not on file  Tobacco Use   Smoking status: Every Day    Packs/day: 1.00    Types: Cigarettes   Smokeless tobacco: Never  Substance and Sexual Activity   Alcohol use: Yes    Comment: ocassionally    Drug use: No   Sexual activity: Not on file  Other Topics Concern   Not on file  Social History Narrative   Not on file   Social Determinants of Health   Financial Resource Strain: Not on file  Food Insecurity: Not on file  Transportation Needs: Not on file  Physical Activity: Not on file  Stress: Not on file  Social Connections: Not on file  Intimate Partner Violence: Not on file      Review of Systems  Constitutional:  Negative for chills, fatigue, fever and unexpected weight change.  HENT:  Negative for congestion, rhinorrhea, sneezing and sore throat.   Eyes:  Negative for redness.  Respiratory:  Negative for cough, chest tightness and shortness of breath.   Cardiovascular:  Negative for chest pain and palpitations.  Gastrointestinal:  Negative for abdominal pain, constipation, diarrhea, nausea and vomiting.  Genitourinary:  Negative for dysuria and frequency.  Musculoskeletal:  Positive for arthralgias, back pain, neck pain and neck stiffness. Negative for joint swelling.  Skin: Negative.  Negative for rash.  Neurological: Negative.  Negative for dizziness, tremors, light-headedness, numbness and headaches.  Hematological:  Negative for adenopathy. Does not bruise/bleed easily.  Psychiatric/Behavioral:  Positive for behavioral problems (Depression). Negative for self-injury, sleep disturbance and suicidal ideas. The patient is nervous/anxious.     Vital Signs: BP 134/78   Pulse 98   Temp 97.8 F (36.6 C)   Resp 16   Ht 5\' 3"  (1.6 m)   Wt 136 lb (61.7 kg)   SpO2 93%   BMI 24.09 kg/m    Physical Exam Vitals reviewed.  Constitutional:      General: She is not in acute distress.    Appearance: Normal appearance. She is normal weight. She is not ill-appearing.   HENT:     Head: Normocephalic and atraumatic.  Eyes:     Pupils: Pupils are equal, round, and reactive to light.  Cardiovascular:     Rate and Rhythm: Normal rate and regular rhythm.  Pulmonary:     Effort: Pulmonary effort is normal. No respiratory distress.  Neurological:     Mental Status: She is alert and oriented to person, place, and time.  Psychiatric:        Mood and Affect: Mood normal.        Behavior: Behavior normal.        Assessment/Plan: 1. Inflammatory polyarthritis (HCC) Start oral diclofenac EC 75 mg twice daily, may continue to use topical voltaren gel.  - diclofenac Sodium (VOLTAREN) 1 % GEL; Apply 4 g topically 4 (four)  times daily.  Dispense: 100 g; Refill: 2  2. Essential hypertension, malignant Stable, continue current medications as prescribed.  - carvedilol (COREG) 6.25 MG tablet; TAKE 1 TABLET(6.25 MG) BY MOUTH TWICE DAILY WITH A MEAL  Dispense: 180 tablet; Refill: 0  3. Medication refill - clindamycin (CLEOCIN T) 1 % external solution; Apply 1 Application topically 2 (two) times daily.  Dispense: 60 mL; Refill: 2 - diclofenac Sodium (VOLTAREN) 1 % GEL; Apply 4 g topically 4 (four) times daily.  Dispense: 100 g; Refill: 2 - levothyroxine (SYNTHROID) 50 MCG tablet; TAKE 1 TABLET(50 MCG) BY MOUTH DAILY ON AN EMPTY STOMACH  Dispense: 90 tablet; Refill: 3 - pantoprazole (PROTONIX) 40 MG tablet; Take 1 tablet (40 mg total) by mouth daily.  Dispense: 90 tablet; Refill: 3 - carvedilol (COREG) 6.25 MG tablet; TAKE 1 TABLET(6.25 MG) BY MOUTH TWICE DAILY WITH A MEAL  Dispense: 180 tablet; Refill: 0 - budesonide-formoterol (SYMBICORT) 160-4.5 MCG/ACT inhaler; Inhale 2 puffs into the lungs 2 (two) times daily.  Dispense: 10.2 g; Refill: 3 - albuterol (VENTOLIN HFA) 108 (90 Base) MCG/ACT inhaler; Inhale 2 puffs into the lungs every 6 (six) hours as needed for wheezing or shortness of breath.  Dispense: 18 g; Refill: 3   General Counseling: Melody Moss verbalizes  understanding of the findings of todays visit and agrees with plan of treatment. I have discussed any further diagnostic evaluation that may be needed or ordered today. We also reviewed her medications today. she has been encouraged to call the office with any questions or concerns that should arise related to todays visit.    No orders of the defined types were placed in this encounter.   Meds ordered this encounter  Medications   clindamycin (CLEOCIN T) 1 % external solution    Sig: Apply 1 Application topically 2 (two) times daily.    Dispense:  60 mL    Refill:  2   diclofenac Sodium (VOLTAREN) 1 % GEL    Sig: Apply 4 g topically 4 (four) times daily.    Dispense:  100 g    Refill:  2   levothyroxine (SYNTHROID) 50 MCG tablet    Sig: TAKE 1 TABLET(50 MCG) BY MOUTH DAILY ON AN EMPTY STOMACH    Dispense:  90 tablet    Refill:  3    ZERO refills remain on this prescription. Your patient is requesting advance approval of refills for this medication to PREVENT ANY MISSED DOSES   pantoprazole (PROTONIX) 40 MG tablet    Sig: Take 1 tablet (40 mg total) by mouth daily.    Dispense:  90 tablet    Refill:  3   carvedilol (COREG) 6.25 MG tablet    Sig: TAKE 1 TABLET(6.25 MG) BY MOUTH TWICE DAILY WITH A MEAL    Dispense:  180 tablet    Refill:  0   DISCONTD: amLODipine (NORVASC) 10 MG tablet    Sig: Take 1 tablet (10 mg total) by mouth daily.    Dispense:  90 tablet    Refill:  3   budesonide-formoterol (SYMBICORT) 160-4.5 MCG/ACT inhaler    Sig: Inhale 2 puffs into the lungs 2 (two) times daily.    Dispense:  10.2 g    Refill:  3   albuterol (VENTOLIN HFA) 108 (90 Base) MCG/ACT inhaler    Sig: Inhale 2 puffs into the lungs every 6 (six) hours as needed for wheezing or shortness of breath.    Dispense:  18 g    Refill:  3   DISCONTD: diclofenac (VOLTAREN) 75 MG EC tablet    Sig: Take 1 tablet (75 mg total) by mouth 2 (two) times daily.    Dispense:  60 tablet    Refill:  2     Return in about 1 month (around 09/20/2022) for F/U.   Total time spent:30 Minutes Time spent includes review of chart, medications, test results, and follow up plan with the patient.   [place] Controlled Substance Database was reviewed by me.  This patient was seen by Sallyanne Kuster, FNP-C in collaboration with Dr. Beverely Risen as a part of collaborative care agreement.   Melody Casillas R. Tedd Sias, MSN, FNP-C Internal medicine

## 2022-08-26 ENCOUNTER — Other Ambulatory Visit: Payer: Self-pay

## 2022-09-04 ENCOUNTER — Telehealth: Payer: Self-pay

## 2022-09-04 ENCOUNTER — Other Ambulatory Visit: Payer: Self-pay | Admitting: Nurse Practitioner

## 2022-09-04 DIAGNOSIS — I10 Essential (primary) hypertension: Secondary | ICD-10-CM

## 2022-09-04 DIAGNOSIS — K219 Gastro-esophageal reflux disease without esophagitis: Secondary | ICD-10-CM

## 2022-09-12 ENCOUNTER — Other Ambulatory Visit: Payer: Self-pay | Admitting: Nurse Practitioner

## 2022-09-12 MED ORDER — CELECOXIB 100 MG PO CAPS: 100.0000 mg | ORAL_CAPSULE | Freq: Two times a day (BID) | ORAL | 1 refills | Status: DC

## 2022-09-19 NOTE — Telephone Encounter (Signed)
Pt advised we send med  

## 2022-09-24 ENCOUNTER — Ambulatory Visit: Payer: Self-pay | Admitting: Nurse Practitioner

## 2022-09-29 ENCOUNTER — Encounter: Payer: Self-pay | Admitting: Nurse Practitioner

## 2022-10-16 ENCOUNTER — Other Ambulatory Visit: Payer: Self-pay | Admitting: Nurse Practitioner

## 2022-10-21 ENCOUNTER — Other Ambulatory Visit: Payer: Self-pay | Admitting: Nurse Practitioner

## 2022-10-21 ENCOUNTER — Telehealth: Payer: Self-pay

## 2022-10-21 DIAGNOSIS — Z760 Encounter for issue of repeat prescription: Secondary | ICD-10-CM

## 2022-10-21 DIAGNOSIS — I10 Essential (primary) hypertension: Secondary | ICD-10-CM

## 2022-10-21 MED ORDER — PREDNISONE 10 MG PO TABS: ORAL_TABLET | ORAL | 0 refills | Status: DC

## 2022-10-21 NOTE — Telephone Encounter (Signed)
Pt called that she is having terrible lower back ,muscle spasms unable to move as per alyssa send prednisone taper advised if she is not feeling need appt

## 2022-11-04 ENCOUNTER — Telehealth: Payer: Self-pay | Admitting: Nurse Practitioner

## 2022-11-04 ENCOUNTER — Other Ambulatory Visit: Payer: Self-pay

## 2022-11-04 NOTE — Telephone Encounter (Signed)
Done change

## 2022-12-24 ENCOUNTER — Other Ambulatory Visit: Payer: Self-pay | Admitting: Nurse Practitioner

## 2022-12-24 ENCOUNTER — Other Ambulatory Visit: Payer: Self-pay

## 2022-12-24 DIAGNOSIS — Z760 Encounter for issue of repeat prescription: Secondary | ICD-10-CM

## 2022-12-24 MED ORDER — LEVOTHYROXINE SODIUM 50 MCG PO TABS: ORAL_TABLET | ORAL | 3 refills | Status: DC

## 2023-01-02 ENCOUNTER — Other Ambulatory Visit: Payer: Self-pay | Admitting: Nurse Practitioner

## 2023-01-14 IMAGING — MR MR THORACIC SPINE W/O CM
6 series · 30 of 48 positions shown · non-contrast
Comparison: None.

CLINICAL DATA: Chronic upper back pain for 10 years, radiation
around chest

EXAM:
MRI THORACIC SPINE WITHOUT CONTRAST
TECHNIQUE: Multiplanar, multisequence MR imaging of the thoracic spine was
performed. No intravenous contrast was administered.

[Series 18: T1 · sagittal · 6.0mm · 1.88mm/px · 2 of 9 slices shown (1 of 2)]
[im 1/9]
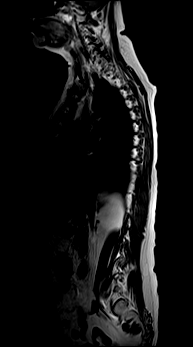
[im 9/9]
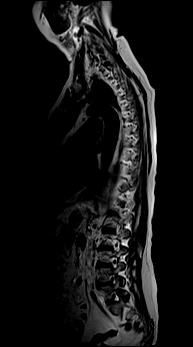

[Series 19: T2 · sagittal · 3.0mm · 1.06mm/px · 6 of 17 slices shown (1 of 2)]
[im 1/17]
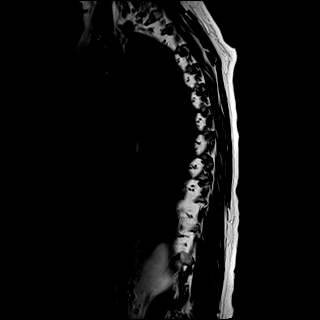
[im 4/17]
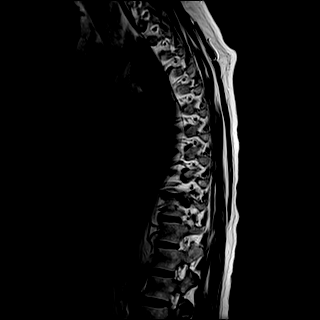
[im 7/17]
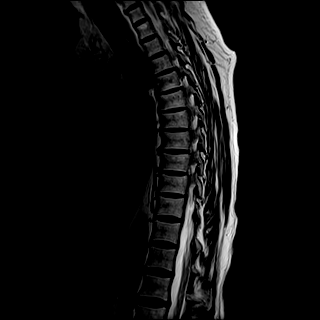
[im 10/17]
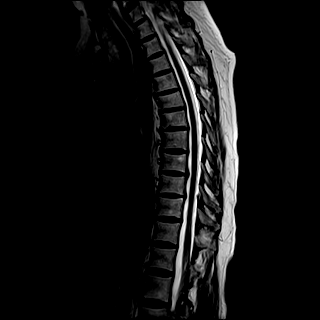
[im 13/17]
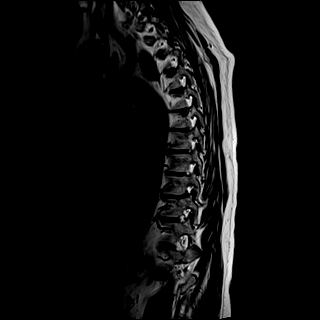
[im 17/17]
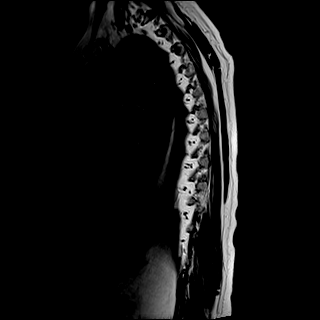

[Series 20: T1 · sagittal · 3.0mm · 1.06mm/px · 6 of 17 slices shown (2 of 2)]
[im 1/17]
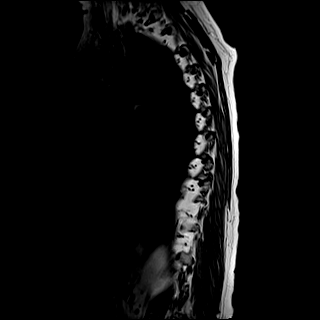
[im 4/17]
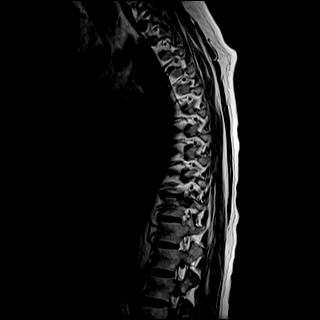
[im 7/17]
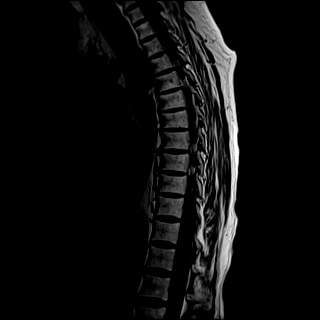
[im 10/17]
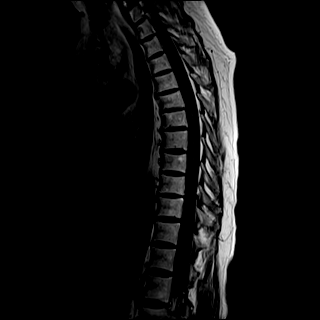
[im 13/17]
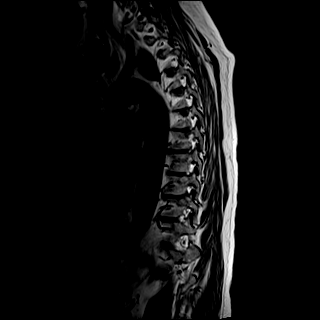
[im 17/17]
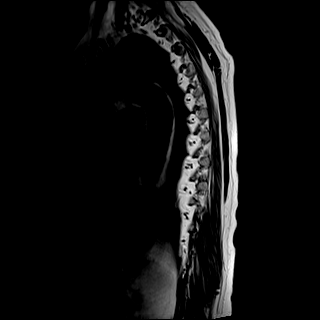

[Series 21: STIR · sagittal · 3.0mm · 0.53mm/px · 6 of 17 slices shown]
[im 1/17]
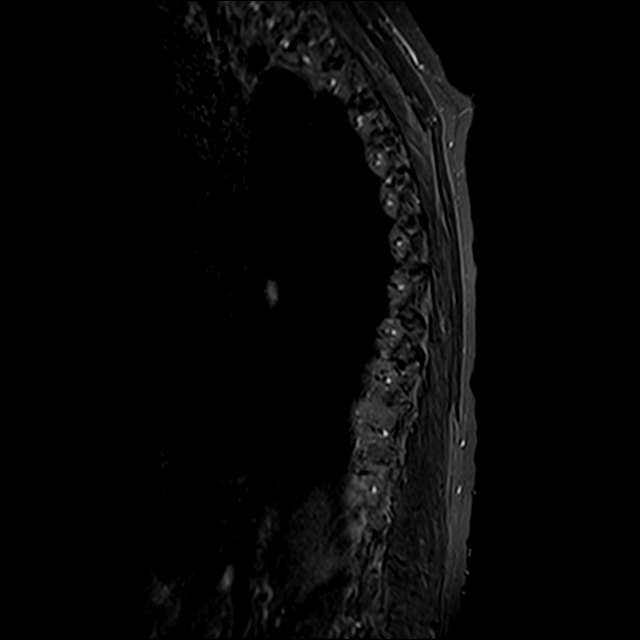
[im 4/17]
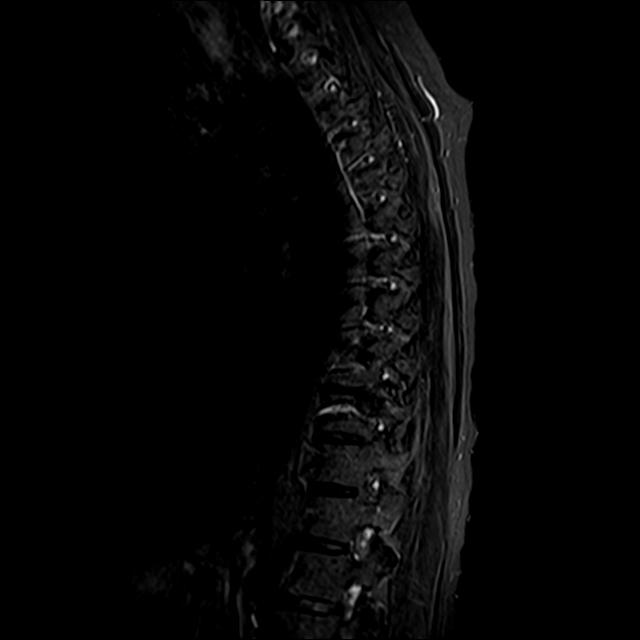
[im 7/17]
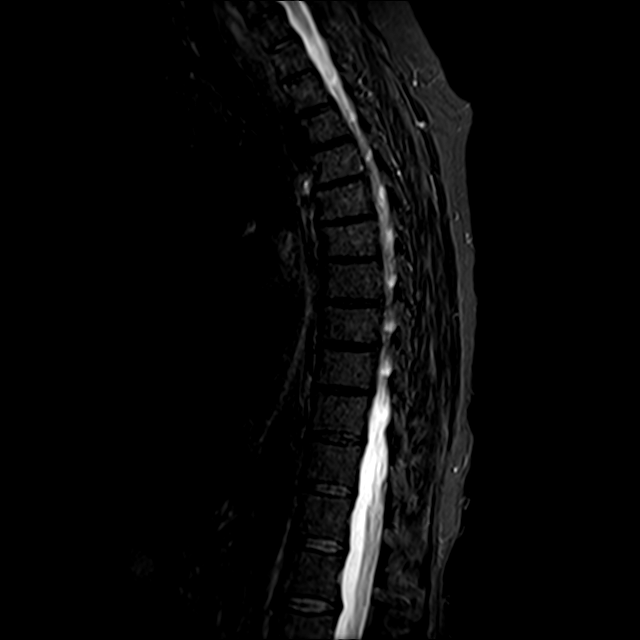
[im 10/17]
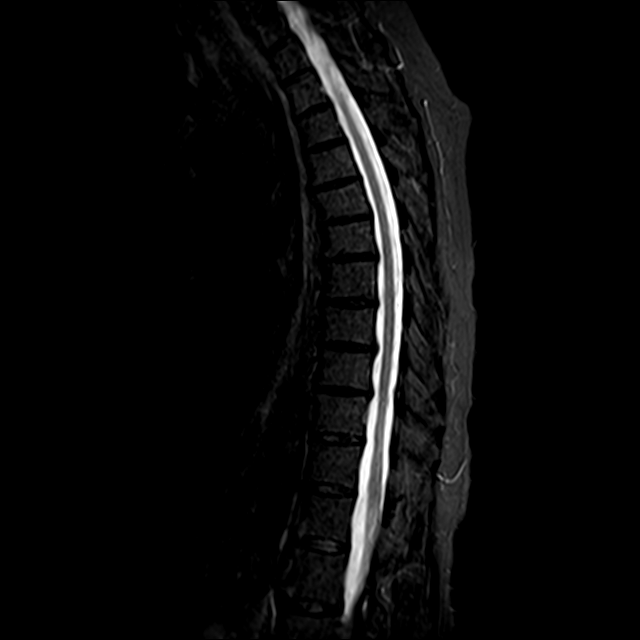
[im 13/17]
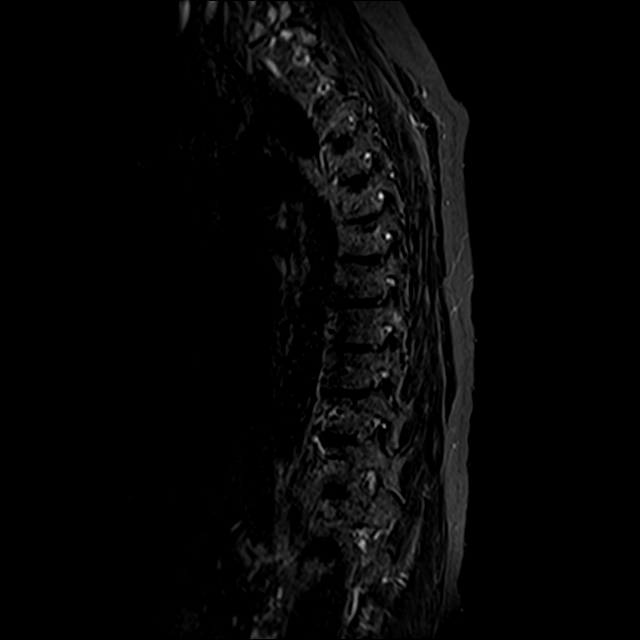
[im 17/17]
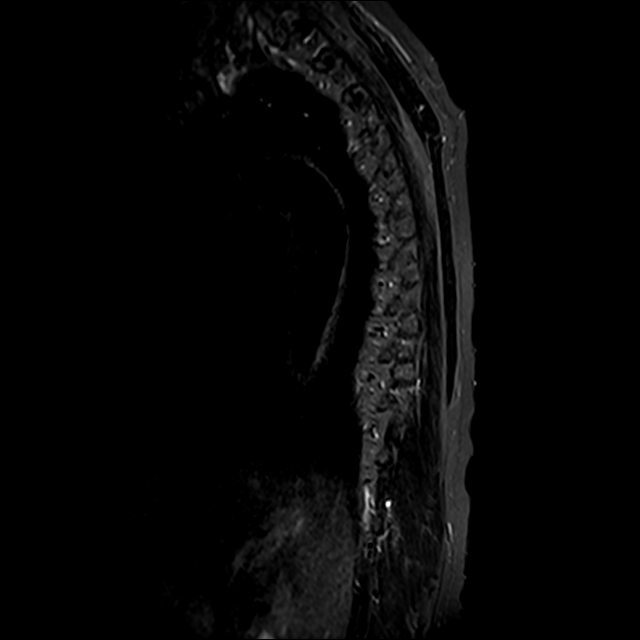

[Series 22: T2 · axial · 4.0mm · 0.59mm/px · z∈[-329,-103]mm · 8 of 39 slices shown (2 of 2)]
[im 1/39]
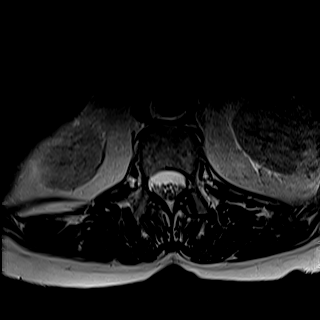
[im 6/39]
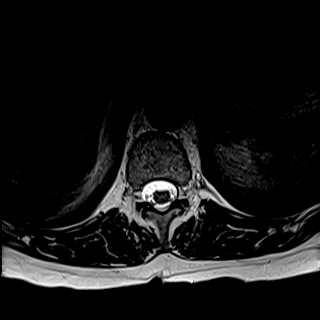
[im 12/39]
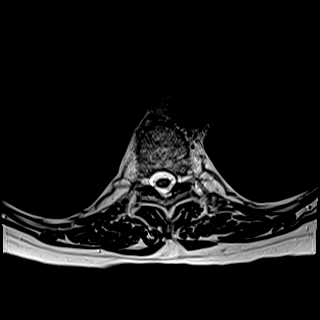
[im 18/39]
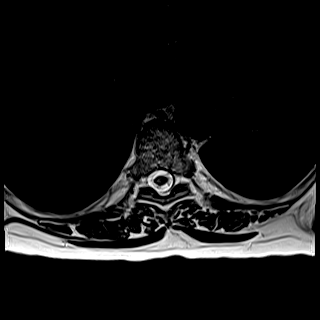
[im 21/39]
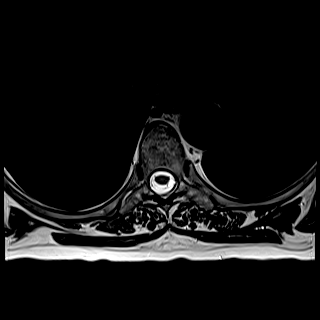
[im 27/39]
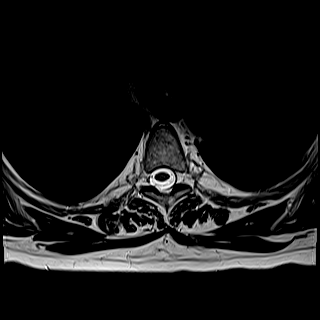
[im 33/39]
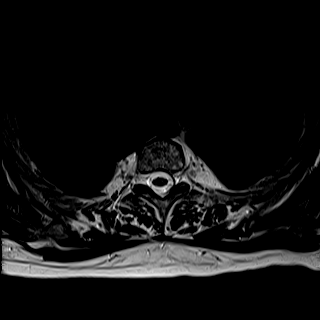
[im 39/39]
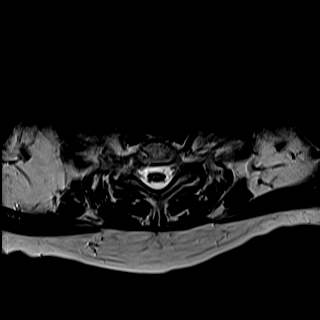

[Series 23: GRE · axial · 4.0mm · 0.37mm/px · z∈[-329,-280]mm · 2 of 39 slices shown]
[im 1/39]
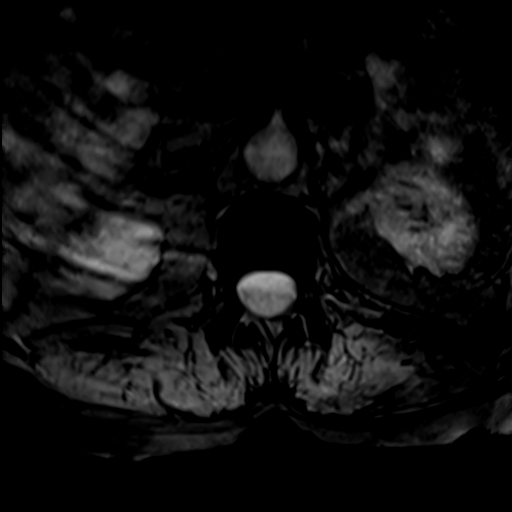
[im 6/39]
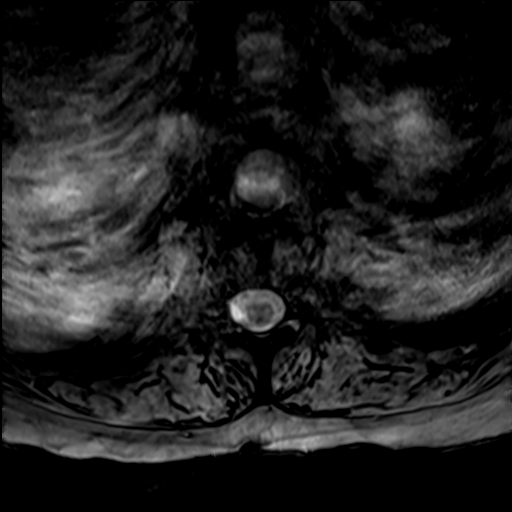

[30 of 48 positions shown; findings below may reference images not displayed]

FINDINGS: Alignment: Mild S-shaped curvature of the thoracolumbar spine, with
mild levocurvature of the upper to midthoracic spine and
dextrocurvature of the lumbar spine. No significant listhesis.

Vertebrae: No acute fracture or suspicious osseous lesion.

Cord:  Normal signal and morphology.

Paraspinal and other soft tissues: Negative.

Disc levels:

Small disc bulges at several levels, the most prominent of which is
at T8-T9 and T9-T10. No significant spinal canal stenosis or neural
foraminal narrowing.
IMPRESSION: Mild degenerative changes and mild spinal curvature, without
significant spinal canal stenosis or neural foraminal narrowing.

## 2023-02-21 ENCOUNTER — Other Ambulatory Visit: Payer: Self-pay | Admitting: Nurse Practitioner

## 2023-02-21 DIAGNOSIS — E782 Mixed hyperlipidemia: Secondary | ICD-10-CM

## 2023-02-25 ENCOUNTER — Encounter: Payer: Self-pay | Admitting: Nurse Practitioner

## 2023-03-21 ENCOUNTER — Other Ambulatory Visit: Payer: Self-pay | Admitting: Nurse Practitioner

## 2023-03-25 ENCOUNTER — Encounter: Payer: Self-pay | Admitting: Nurse Practitioner

## 2023-04-04 ENCOUNTER — Ambulatory Visit: Payer: Self-pay | Admitting: Nurse Practitioner

## 2023-04-04 ENCOUNTER — Encounter: Payer: Self-pay | Admitting: Nurse Practitioner

## 2023-04-04 VITALS — BP 135/80 | HR 78 | Temp 98.1°F | Resp 16 | Ht 63.0 in | Wt 130.2 lb

## 2023-04-04 DIAGNOSIS — J0100 Acute maxillary sinusitis, unspecified: Secondary | ICD-10-CM | POA: Diagnosis not present

## 2023-04-04 DIAGNOSIS — I10 Essential (primary) hypertension: Secondary | ICD-10-CM

## 2023-04-04 DIAGNOSIS — R0602 Shortness of breath: Secondary | ICD-10-CM | POA: Diagnosis not present

## 2023-04-04 MED ORDER — PREDNISONE 10 MG (21) PO TBPK
ORAL_TABLET | ORAL | 0 refills | Status: DC
Start: 2023-04-04 — End: 2023-05-05

## 2023-04-04 MED ORDER — AMOXICILLIN-POT CLAVULANATE 875-125 MG PO TABS
1.0000 | ORAL_TABLET | Freq: Two times a day (BID) | ORAL | 0 refills | Status: AC
Start: 2023-04-04 — End: 2023-04-14

## 2023-04-04 MED ORDER — IPRATROPIUM-ALBUTEROL 20-100 MCG/ACT IN AERS
1.0000 | INHALATION_SPRAY | Freq: Four times a day (QID) | RESPIRATORY_TRACT | 3 refills | Status: DC
Start: 2023-04-04 — End: 2023-11-04

## 2023-04-04 NOTE — Progress Notes (Signed)
[facility] [address] [place], [place] [address]  Internal MEDICINE  Office Visit Note  Patient Name: Melody Moss  [id number]  [id number]  Date of Service: 04/04/2023  Chief Complaint  Patient presents with   Acute Visit     HPI Melody Moss presents for an acute sick visit for SOB, hot flashes and high blood pressure.  Had infection in teeth, put on antibiotics, but it got worse and went to her sinuses, gets dizzy, hot flashes, coughing, SOB, congestion, chills, headaches  High blood pressure -- carvedilol and amlodipine, may just be elevated because she is not feeling well. Improved when rechecked      Current Medication:  Outpatient Encounter Medications as of 04/04/2023  Medication Sig   albuterol (VENTOLIN HFA) 108 (90 Base) MCG/ACT inhaler Inhale 2 puffs into the lungs every 6 (six) hours as needed for wheezing or shortness of breath.   amLODipine (NORVASC) 10 MG tablet TAKE 1 TABLET(10 MG) BY MOUTH DAILY   amoxicillin-clavulanate (AUGMENTIN) 875-125 MG tablet Take 1 tablet by mouth 2 (two) times daily for 10 days.   budesonide-formoterol (SYMBICORT) 160-4.5 MCG/ACT inhaler Inhale 2 puffs into the lungs 2 (two) times daily.   buPROPion (WELLBUTRIN XL) 150 MG 24 hr tablet TK 1 T PO QAM   carbamazepine (CARBATROL) 300 MG 12 hr capsule TK 2 [place] PO QHS   carvedilol (COREG) 6.25 MG tablet TAKE 1 TABLET(6.25 MG) BY MOUTH TWICE DAILY WITH A MEAL   celecoxib (CELEBREX) 100 MG capsule TAKE 1 CAPSULE BY MOUTH TWICE A DAY   clindamycin (CLEOCIN T) 1 % external solution Apply 1 Application topically 2 (two) times daily.   cloNIDine (CATAPRES) 0.1 MG tablet TAKE 1 TABLET BY MOUTH TWICE DAILY FOR ELEVATED BLOOD PRESSURE MORE THAN 160   diclofenac Sodium (VOLTAREN) 1 % GEL Apply 4 g topically 4 (four) times daily.   dicyclomine (BENTYL) 20 MG tablet Take by mouth.   Ipratropium-Albuterol (COMBIVENT) 20-100 MCG/ACT AERS respimat Inhale 1 puff into the lungs every 6 (six) hours.    LATUDA 60 MG TABS TK 1 T PO WITH DINNER   levothyroxine (SYNTHROID) 50 MCG tablet Take 1 tab po daily on an empty stomach   lidocaine (LIDODERM) 5 % Place 1 patch onto the skin daily. Remove & Discard patch within 12 hours or as directed by MD   NUEDEXTA 20-10 MG CAPS TK ONE C PO BID FOR PSEUDOBULBAR EFFECT   omeprazole (PRILOSEC) 40 MG capsule TAKE 1 CAPSULE(40 MG) BY MOUTH DAILY   pantoprazole (PROTONIX) 40 MG tablet Take 1 tablet (40 mg total) by mouth daily.   predniSONE (STERAPRED UNI-PAK 21 TAB) 10 MG (21) TBPK tablet Use as directed for 6 days   rosuvastatin (CRESTOR) 5 MG tablet TAKE 1 TABLET BY MOUTH DAILY   [DISCONTINUED] predniSONE (DELTASONE) 10 MG tablet Use as directed for 6 days   No facility-administered encounter medications on file as of 04/04/2023.      Medical History: Past Medical History:  Diagnosis Date   Bipolar 1 disorder (HCC)    Depression    Hypertension    Panic attack      Vital Signs: BP 135/80 Comment: 162/92  Pulse 78   Temp 98.1 F (36.7 C)   Resp 16   Ht 5\' 3"  (1.6 m)   Wt 130 lb 3.2 oz (59.1 kg)   SpO2 97%   BMI 23.06 kg/m    Review of Systems  Constitutional:  Positive for chills, fatigue and fever.  HENT:  Positive for congestion (green and yellow drainage), postnasal drip, sinus pressure, sinus pain and sore throat.   Respiratory:  Positive for cough, chest tightness, shortness of breath and wheezing.   Cardiovascular: Negative.  Negative for chest pain and palpitations.  Gastrointestinal:  Positive for nausea. Negative for diarrhea and vomiting.  Endocrine: Positive for heat intolerance.  Neurological:  Positive for headaches.    Physical Exam Vitals reviewed.  Constitutional:      General: She is not in acute distress.    Appearance: Normal appearance. She is normal weight. She is ill-appearing.  HENT:     Head: Normocephalic and atraumatic.     Right Ear: Tympanic membrane, ear canal and external ear normal.     Left  Ear: Tympanic membrane, ear canal and external ear normal.     Nose: Mucosal edema, congestion and rhinorrhea present.     Right Turbinates: Swollen and pale.     Left Turbinates: Swollen and pale.     Right Sinus: Maxillary sinus tenderness present.     Left Sinus: Maxillary sinus tenderness present.     Mouth/Throat:     Mouth: Mucous membranes are moist.     Pharynx: Posterior oropharyngeal erythema present.  Eyes:     Pupils: Pupils are equal, round, and reactive to light.  Cardiovascular:     Rate and Rhythm: Normal rate and regular rhythm.     Heart sounds: Normal heart sounds. No murmur heard. Pulmonary:     Effort: Pulmonary effort is normal. No respiratory distress.     Breath sounds: Normal breath sounds. No wheezing.  Lymphadenopathy:     Cervical: Cervical adenopathy present.  Neurological:     Mental Status: She is alert and oriented to person, place, and time.  Psychiatric:        Mood and Affect: Mood normal.        Behavior: Behavior normal.       Assessment/Plan: 1. Acute non-recurrent maxillary sinusitis Augmentin and prednisone taper prescribed. Combivent inhaler prescribed for SOB - Ipratropium-Albuterol (COMBIVENT) 20-100 MCG/ACT AERS respimat; Inhale 1 puff into the lungs every 6 (six) hours.  Dispense: 4 g; Refill: 3 - amoxicillin-clavulanate (AUGMENTIN) 875-125 MG tablet; Take 1 tablet by mouth 2 (two) times daily for 10 days.  Dispense: 20 tablet; Refill: 0 - predniSONE (STERAPRED UNI-PAK 21 TAB) 10 MG (21) TBPK tablet; Use as directed for 6 days  Dispense: 21 tablet; Refill: 0  2. Essential hypertension, malignant Stable, but will see if BP still ok when she feels better.   3. SOB (shortness of breath) Prednisone taper and combivent inhaler prescribed.  - Ipratropium-Albuterol (COMBIVENT) 20-100 MCG/ACT AERS respimat; Inhale 1 puff into the lungs every 6 (six) hours.  Dispense: 4 g; Refill: 3 - predniSONE (STERAPRED UNI-PAK 21 TAB) 10 MG (21) TBPK  tablet; Use as directed for 6 days  Dispense: 21 tablet; Refill: 0   General Counseling: Freedom verbalizes understanding of the findings of todays visit and agrees with plan of treatment. I have discussed any further diagnostic evaluation that may be needed or ordered today. We also reviewed her medications today. she has been encouraged to call the office with any questions or concerns that should arise related to todays visit.    Counseling:    No orders of the defined types were placed in this encounter.   Meds ordered this encounter  Medications   Ipratropium-Albuterol (COMBIVENT) 20-100 MCG/ACT AERS respimat    Sig: Inhale 1 puff into the  lungs every 6 (six) hours.    Dispense:  4 g    Refill:  3   amoxicillin-clavulanate (AUGMENTIN) 875-125 MG tablet    Sig: Take 1 tablet by mouth 2 (two) times daily for 10 days.    Dispense:  20 tablet    Refill:  0   predniSONE (STERAPRED UNI-PAK 21 TAB) 10 MG (21) TBPK tablet    Sig: Use as directed for 6 days    Dispense:  21 tablet    Refill:  0    Return if symptoms worsen or fail to improve, for and upcoming CPE in june with Alverta Caccamo PCP.  [place] Controlled Substance Database was reviewed by me for overdose risk score (ORS)  Time spent:30 Minutes Time spent with patient included reviewing progress notes, labs, imaging studies, and discussing plan for follow up.   This patient was seen by Sallyanne Kuster, FNP-C in collaboration with Dr. Beverely Risen as a part of collaborative care agreement.  Orvilla Truett R. Tedd Sias, MSN, FNP-C Internal Medicine

## 2023-05-05 ENCOUNTER — Ambulatory Visit: Payer: Self-pay | Admitting: Nurse Practitioner

## 2023-05-05 ENCOUNTER — Encounter: Payer: Self-pay | Admitting: Nurse Practitioner

## 2023-05-05 ENCOUNTER — Telehealth: Payer: Self-pay | Admitting: Nurse Practitioner

## 2023-05-05 VITALS — BP 126/80 | HR 88 | Temp 98.8°F | Resp 16 | Ht 63.0 in | Wt 132.0 lb

## 2023-05-05 DIAGNOSIS — M064 Inflammatory polyarthropathy: Secondary | ICD-10-CM

## 2023-05-05 DIAGNOSIS — E782 Mixed hyperlipidemia: Secondary | ICD-10-CM

## 2023-05-05 DIAGNOSIS — E039 Hypothyroidism, unspecified: Secondary | ICD-10-CM

## 2023-05-05 DIAGNOSIS — E559 Vitamin D deficiency, unspecified: Secondary | ICD-10-CM

## 2023-05-05 DIAGNOSIS — Z0001 Encounter for general adult medical examination with abnormal findings: Secondary | ICD-10-CM

## 2023-05-05 DIAGNOSIS — Z1231 Encounter for screening mammogram for malignant neoplasm of breast: Secondary | ICD-10-CM | POA: Diagnosis not present

## 2023-05-05 DIAGNOSIS — I10 Essential (primary) hypertension: Secondary | ICD-10-CM

## 2023-05-05 DIAGNOSIS — L720 Epidermal cyst: Secondary | ICD-10-CM

## 2023-05-05 DIAGNOSIS — E538 Deficiency of other specified B group vitamins: Secondary | ICD-10-CM

## 2023-05-05 DIAGNOSIS — J0100 Acute maxillary sinusitis, unspecified: Secondary | ICD-10-CM

## 2023-05-05 DIAGNOSIS — F17210 Nicotine dependence, cigarettes, uncomplicated: Secondary | ICD-10-CM

## 2023-05-05 DIAGNOSIS — R300 Dysuria: Secondary | ICD-10-CM

## 2023-05-05 MED ORDER — BUDESONIDE-FORMOTEROL FUMARATE 160-4.5 MCG/ACT IN AERO: 2.0000 | INHALATION_SPRAY | Freq: Two times a day (BID) | RESPIRATORY_TRACT | 3 refills | Status: DC

## 2023-05-05 MED ORDER — CARVEDILOL 6.25 MG PO TABS
ORAL_TABLET | ORAL | 1 refills | Status: DC
Start: 2023-05-05 — End: 2023-08-05

## 2023-05-05 MED ORDER — PREDNISONE 10 MG (21) PO TBPK
ORAL_TABLET | ORAL | 0 refills | Status: DC
Start: 2023-05-05 — End: 2023-09-03

## 2023-05-05 MED ORDER — CELECOXIB 100 MG PO CAPS: 100.0000 mg | ORAL_CAPSULE | Freq: Two times a day (BID) | ORAL | 1 refills | Status: DC

## 2023-05-05 MED ORDER — DOXYCYCLINE HYCLATE 100 MG PO TABS
100.0000 mg | ORAL_TABLET | Freq: Two times a day (BID) | ORAL | 0 refills | Status: DC
Start: 2023-05-05 — End: 2023-10-14

## 2023-05-05 NOTE — Telephone Encounter (Signed)
Dermatology referral sent via [employer] to Dr. Roseanne Kaufman w/ [facility]

## 2023-05-05 NOTE — Addendum Note (Signed)
Addended by: Norva Pavlov on: 05/05/2023 03:52 PM   Modules accepted: Orders

## 2023-05-05 NOTE — Telephone Encounter (Signed)
CT order faxed to [facility] 

## 2023-05-05 NOTE — Progress Notes (Signed)
[facility] [address] [place], [place] [address]  Internal MEDICINE  Office Visit Note  Patient Name: Melody Moss  [id number]  [id number]  Date of Service: 05/05/2023  Chief Complaint  Patient presents with   Depression   Hypertension   Annual Exam    HPI Melody Moss presents for an annual well visit and physical exam.  Well-appearing 62 y.o. female with hypertension, migraines, varicose veins, GERD, inflammatory polyarthritis, high cholesterol, GAD, and hypothyroidism.  Routine CRC screening: cologuard done last year, negative  Routine mammogram: overdue  Pap smear: due in December 2025 Labs: overdue, did not have labs done last year  New or worsening pain: chronic joint pains Other concerns:  Lump on left side of back left upper back -- epidermoid cyst.  Wants to work on quitting smoking -- tried to start with 21 mg nicotine patch and it was too strong for her and made her sick. Only smokes 1 ppd x>30 years, needs to start on lower dose of nicotine patch.  Due for lung cancer screening   Functional Status Survey: Is the patient deaf or have difficulty hearing?: No Does the patient have difficulty seeing, even when wearing glasses/contacts?: No Does the patient have difficulty concentrating, remembering, or making decisions?: Yes Does the patient have difficulty walking or climbing stairs?: No Does the patient have difficulty dressing or bathing?: No Does the patient have difficulty doing errands alone such as visiting a doctor's office or shopping?: No     12/20/2020    3:02 PM 08/21/2021    4:31 PM 02/19/2022    2:40 PM 08/20/2022    2:39 PM 05/05/2023    1:31 PM  Fall Risk  Falls in the past year? 0 1 0 0 1  Was there an injury with Fall?  0   0  Fall Risk Category Calculator  2   2  Fall Risk Category (Retired)  Moderate     (RETIRED) Patient Fall Risk Level  High fall risk Low fall risk Low fall risk   Patient at Risk for Falls Due to No Fall Risks Other  (Comment) No Fall Risks No Fall Risks   Patient at Risk for Falls Due to - Comments  dizzy     Fall risk Follow up Falls evaluation completed Falls evaluation completed Falls evaluation completed Falls evaluation completed Falls evaluation completed       08/20/2022    2:39 PM  Depression screen PHQ 2/9  Decreased Interest 1  Down, Depressed, Hopeless 0  PHQ - 2 Score 1       Current Medication: Outpatient Encounter Medications as of 05/05/2023  Medication Sig   amLODipine (NORVASC) 10 MG tablet TAKE 1 TABLET(10 MG) BY MOUTH DAILY   buPROPion (WELLBUTRIN XL) 150 MG 24 hr tablet TK 1 T PO QAM   carbamazepine (CARBATROL) 300 MG 12 hr capsule TK 2 Francis PO QHS   clindamycin (CLEOCIN T) 1 % external solution Apply 1 Application topically 2 (two) times daily.   diclofenac Sodium (VOLTAREN) 1 % GEL Apply 4 g topically 4 (four) times daily.   dicyclomine (BENTYL) 20 MG tablet Take by mouth.   doxycycline (VIBRA-TABS) 100 MG tablet Take 1 tablet (100 mg total) by mouth 2 (two) times daily.   Ipratropium-Albuterol (COMBIVENT) 20-100 MCG/ACT AERS respimat Inhale 1 puff into the lungs every 6 (six) hours.   LATUDA 60 MG TABS TK 1 T PO WITH DINNER   levothyroxine (SYNTHROID) 50 MCG tablet Take 1 tab po  daily on an empty stomach   lidocaine (LIDODERM) 5 % Place 1 patch onto the skin daily. Remove & Discard patch within 12 hours or as directed by MD   NUEDEXTA 20-10 MG CAPS TK ONE C PO BID FOR PSEUDOBULBAR EFFECT   omeprazole (PRILOSEC) 40 MG capsule TAKE 1 CAPSULE(40 MG) BY MOUTH DAILY   pantoprazole (PROTONIX) 40 MG tablet Take 1 tablet (40 mg total) by mouth daily.   predniSONE (STERAPRED UNI-PAK 21 TAB) 10 MG (21) TBPK tablet Use as directed for 6 days   rosuvastatin (CRESTOR) 5 MG tablet TAKE 1 TABLET BY MOUTH DAILY   [DISCONTINUED] albuterol (VENTOLIN HFA) 108 (90 Base) MCG/ACT inhaler Inhale 2 puffs into the lungs every 6 (six) hours as needed for wheezing or shortness of breath.    [DISCONTINUED] budesonide-formoterol (SYMBICORT) 160-4.5 MCG/ACT inhaler Inhale 2 puffs into the lungs 2 (two) times daily.   [DISCONTINUED] carvedilol (COREG) 6.25 MG tablet TAKE 1 TABLET(6.25 MG) BY MOUTH TWICE DAILY WITH A MEAL   [DISCONTINUED] celecoxib (CELEBREX) 100 MG capsule TAKE 1 CAPSULE BY MOUTH TWICE A DAY   [DISCONTINUED] cloNIDine (CATAPRES) 0.1 MG tablet TAKE 1 TABLET BY MOUTH TWICE DAILY FOR ELEVATED BLOOD PRESSURE MORE THAN 160   [DISCONTINUED] predniSONE (STERAPRED UNI-PAK 21 TAB) 10 MG (21) TBPK tablet Use as directed for 6 days   budesonide-formoterol (SYMBICORT) 160-4.5 MCG/ACT inhaler Inhale 2 puffs into the lungs 2 (two) times daily.   carvedilol (COREG) 6.25 MG tablet TAKE 1 TABLET(6.25 MG) BY MOUTH TWICE DAILY WITH A MEAL   celecoxib (CELEBREX) 100 MG capsule Take 1 capsule (100 mg total) by mouth 2 (two) times daily.   No facility-administered encounter medications on file as of 05/05/2023.    Surgical History: Past Surgical History:  Procedure Laterality Date   COSMETIC SURGERY     LAPAROSCOPIC OOPHORECTOMY     few  times   TONSILLECTOMY Bilateral     Medical History: Past Medical History:  Diagnosis Date   Bipolar 1 disorder (HCC)    Depression    Hypertension    Panic attack     Family History: Family History  Adopted: Yes    Social History   Socioeconomic History   Marital status: Single    Spouse name: Not on file   Number of children: Not on file   Years of education: Not on file   Highest education level: Not on file  Occupational History   Not on file  Tobacco Use   Smoking status: Every Day    Packs/day: 1    Types: Cigarettes   Smokeless tobacco: Never  Substance and Sexual Activity   Alcohol use: Yes    Comment: ocassionally    Drug use: No   Sexual activity: Not on file  Other Topics Concern   Not on file  Social History Narrative   Not on file   Social Determinants of Health   Financial Resource Strain: Not on file   Food Insecurity: Not on file  Transportation Needs: Not on file  Physical Activity: Not on file  Stress: Not on file  Social Connections: Not on file  Intimate Partner Violence: Not on file      Review of Systems  Constitutional:  Positive for fatigue. Negative for activity change, appetite change, chills, fever and unexpected weight change.  HENT:  Positive for congestion, ear pain, postnasal drip, rhinorrhea, sinus pressure, sinus pain, sneezing, sore throat and voice change. Negative for trouble swallowing.   Eyes: Negative.  Respiratory:  Positive for cough, shortness of breath and wheezing. Negative for chest tightness.   Cardiovascular: Negative.  Negative for chest pain and palpitations.  Gastrointestinal: Negative.  Negative for abdominal pain, blood in stool, constipation, diarrhea, nausea and vomiting.  Endocrine: Negative.   Genitourinary: Negative.  Negative for difficulty urinating, dysuria, frequency, hematuria and urgency.  Musculoskeletal: Negative.  Negative for arthralgias, back pain, joint swelling, myalgias and neck pain.  Skin: Negative.  Negative for rash and wound.  Allergic/Immunologic: Negative.  Negative for immunocompromised state.  Neurological:  Positive for headaches. Negative for dizziness, seizures and numbness.  Hematological: Negative.   Psychiatric/Behavioral:  Positive for depression. Negative for behavioral problems, self-injury, sleep disturbance and suicidal ideas. The patient is not nervous/anxious.     Vital Signs: BP 126/80   Pulse 88   Temp 98.8 F (37.1 C)   Resp 16   Ht 5\' 3"  (1.6 m)   Wt 132 lb (59.9 kg)   SpO2 94%   BMI 23.38 kg/m    Physical Exam Vitals reviewed.  Constitutional:      General: She is not in acute distress.    Appearance: Normal appearance. She is well-developed and normal weight. She is not ill-appearing or diaphoretic.  HENT:     Head: Normocephalic and atraumatic.     Right Ear: Tympanic membrane, ear  canal and external ear normal.     Left Ear: Tympanic membrane, ear canal and external ear normal. Tenderness present.     Nose: Mucosal edema, congestion and rhinorrhea present.     Right Turbinates: Swollen and pale.     Left Turbinates: Swollen and pale.     Right Sinus: No maxillary sinus tenderness or frontal sinus tenderness.     Left Sinus: Maxillary sinus tenderness and frontal sinus tenderness present.     Mouth/Throat:     Mouth: Mucous membranes are moist.     Pharynx: Posterior oropharyngeal erythema present.     Tonsils: 2+ on the right. 2+ on the left.  Eyes:     General: No scleral icterus.       Right eye: No discharge.        Left eye: No discharge.     Extraocular Movements: Extraocular movements intact.     Conjunctiva/sclera: Conjunctivae normal.     Pupils: Pupils are equal, round, and reactive to light.  Neck:     Thyroid: No thyromegaly.     Vascular: No JVD.     Trachea: No tracheal deviation.  Cardiovascular:     Rate and Rhythm: Normal rate and regular rhythm.     Heart sounds: Normal heart sounds. No murmur heard.    No friction rub. No gallop.  Pulmonary:     Effort: Pulmonary effort is normal. No respiratory distress.     Breath sounds: Normal breath sounds. No stridor. No wheezing or rales.  Chest:     Chest wall: No tenderness.  Abdominal:     General: Bowel sounds are normal. There is no distension.     Palpations: Abdomen is soft. There is no mass.     Tenderness: There is no abdominal tenderness. There is no guarding or rebound.  Musculoskeletal:        General: No tenderness or deformity. Normal range of motion.     Cervical back: Normal range of motion and neck supple.  Lymphadenopathy:     Cervical: No cervical adenopathy.  Skin:    General: Skin is warm and dry.  Capillary Refill: Capillary refill takes less than 2 seconds.     Coloration: Skin is not pale.     Findings: No erythema or rash.          Comments: Marked area is  location of the epidermoid cyst on the patient's upper left back.   Neurological:     Mental Status: She is alert and oriented to person, place, and time.     Cranial Nerves: No cranial nerve deficit.     Motor: No abnormal muscle tone.     Coordination: Coordination normal.     Gait: Gait normal.     Deep Tendon Reflexes: Reflexes are normal and symmetric.  Psychiatric:        Mood and Affect: Mood normal.        Behavior: Behavior normal.        Thought Content: Thought content normal.        Judgment: Judgment normal.        Assessment/Plan: 1. Encounter for routine adult health examination with abnormal findings Age-appropriate preventive screenings and vaccinations discussed, annual physical exam completed. Routine labs for health maintenance ordered, see below. PHM updated. Routine med refills ordered  - MM 3D SCREENING MAMMOGRAM BILATERAL BREAST; Future - CBC with Differential/Platelet - CMP14+EGFR - Lipid Profile - Vitamin D (25 hydroxy) - B12 and Folate Panel - TSH + free T4 - budesonide-formoterol (SYMBICORT) 160-4.5 MCG/ACT inhaler; Inhale 2 puffs into the lungs 2 (two) times daily.  Dispense: 10.2 g; Refill: 3 - carvedilol (COREG) 6.25 MG tablet; TAKE 1 TABLET(6.25 MG) BY MOUTH TWICE DAILY WITH A MEAL  Dispense: 180 tablet; Refill: 1 - celecoxib (CELEBREX) 100 MG capsule; Take 1 capsule (100 mg total) by mouth 2 (two) times daily.  Dispense: 60 capsule; Refill: 1  2. Acute non-recurrent maxillary sinusitis Empiric antibiotic and prednisone taper prescribed.  - doxycycline (VIBRA-TABS) 100 MG tablet; Take 1 tablet (100 mg total) by mouth 2 (two) times daily.  Dispense: 20 tablet; Refill: 0 - predniSONE (STERAPRED UNI-PAK 21 TAB) 10 MG (21) TBPK tablet; Use as directed for 6 days  Dispense: 21 tablet; Refill: 0  3. Epidermoid cyst Referred to dermatology  - Ambulatory referral to Dermatology  4. Acquired hypothyroidism Routine labs ordered  - CBC with  Differential/Platelet - CMP14+EGFR - Lipid Profile - TSH + free T4  5. Mixed hyperlipidemia Routine labs ordered  - CBC with Differential/Platelet - CMP14+EGFR - Lipid Profile  6. B12 deficiency Routine lab ordered  - B12 and Folate Panel  7. Vitamin D deficiency Rotuine lab ordered  - Vitamin D (25 hydroxy)  8. Encounter for screening mammogram for malignant neoplasm of breast Routine mammogram ordered  - MM 3D SCREENING MAMMOGRAM BILATERAL BREAST; Future  9. Smokes less than 2 packs a day with greater than 30 pack year history Routine low dose CT chest ordered  - CT CHEST LUNG CA SCREEN LOW DOSE W/O CM; Future     General Counseling: Melda verbalizes understanding of the findings of todays visit and agrees with plan of treatment. I have discussed any further diagnostic evaluation that may be needed or ordered today. We also reviewed her medications today. she has been encouraged to call the office with any questions or concerns that should arise related to todays visit.    Orders Placed This Encounter  Procedures   MM 3D SCREENING MAMMOGRAM BILATERAL BREAST   CT CHEST LUNG CA SCREEN LOW DOSE W/O CM   CBC with Differential/Platelet   CMP14+EGFR  Lipid Profile   Vitamin D (25 hydroxy)   B12 and Folate Panel   TSH + free T4   Ambulatory referral to Dermatology    Meds ordered this encounter  Medications   budesonide-formoterol (SYMBICORT) 160-4.5 MCG/ACT inhaler    Sig: Inhale 2 puffs into the lungs 2 (two) times daily.    Dispense:  10.2 g    Refill:  3   carvedilol (COREG) 6.25 MG tablet    Sig: TAKE 1 TABLET(6.25 MG) BY MOUTH TWICE DAILY WITH A MEAL    Dispense:  180 tablet    Refill:  1   celecoxib (CELEBREX) 100 MG capsule    Sig: Take 1 capsule (100 mg total) by mouth 2 (two) times daily.    Dispense:  60 capsule    Refill:  1    Maximum Refills Reached   doxycycline (VIBRA-TABS) 100 MG tablet    Sig: Take 1 tablet (100 mg total) by mouth 2 (two)  times daily.    Dispense:  20 tablet    Refill:  0   predniSONE (STERAPRED UNI-PAK 21 TAB) 10 MG (21) TBPK tablet    Sig: Use as directed for 6 days    Dispense:  21 tablet    Refill:  0    Return in about 6 months (around 11/04/2023) for F/U, Cambri Plourde PCP.   Total time spent:30 Minutes Time spent includes review of chart, medications, test results, and follow up plan with the patient.   [place] Controlled Substance Database was reviewed by me.  This patient was seen by Sallyanne Kuster, FNP-C in collaboration with Dr. Beverely Risen as a part of collaborative care agreement.  Ermal Brzozowski R. Tedd Sias, MSN, FNP-C Internal medicine

## 2023-05-06 LAB — MICROSCOPIC EXAMINATION

## 2023-05-06 LAB — UA/M W/RFLX CULTURE, ROUTINE
Nitrite, UA: NEGATIVE
Specific Gravity, UA: 1.022 (ref 1.005–1.030)

## 2023-05-08 LAB — URINE CULTURE, REFLEX

## 2023-05-08 LAB — UA/M W/RFLX CULTURE, ROUTINE
Bilirubin, UA: NEGATIVE
Glucose, UA: NEGATIVE
Protein,UA: NEGATIVE
RBC, UA: NEGATIVE
Urobilinogen, Ur: 1 mg/dL (ref 0.2–1.0)
pH, UA: 7 (ref 5.0–7.5)

## 2023-05-08 LAB — MICROSCOPIC EXAMINATION
Bacteria, UA: NONE SEEN
Casts: NONE SEEN /LPF

## 2023-05-09 ENCOUNTER — Other Ambulatory Visit
Admission: RE | Admit: 2023-05-09 | Discharge: 2023-05-09 | Disposition: A | Discharge status: Home / Self Care | Payer: Medicaid Other | Attending: Nurse Practitioner | Admitting: Nurse Practitioner

## 2023-05-09 DIAGNOSIS — Z0001 Encounter for general adult medical examination with abnormal findings: Secondary | ICD-10-CM | POA: Diagnosis present

## 2023-05-09 DIAGNOSIS — E538 Deficiency of other specified B group vitamins: Secondary | ICD-10-CM | POA: Insufficient documentation

## 2023-05-09 DIAGNOSIS — E039 Hypothyroidism, unspecified: Secondary | ICD-10-CM | POA: Diagnosis not present

## 2023-05-09 DIAGNOSIS — E782 Mixed hyperlipidemia: Secondary | ICD-10-CM | POA: Diagnosis not present

## 2023-05-09 DIAGNOSIS — E559 Vitamin D deficiency, unspecified: Secondary | ICD-10-CM | POA: Diagnosis not present

## 2023-05-09 LAB — CBC WITH DIFFERENTIAL/PLATELET
Abs Immature Granulocytes: 0.03 10*3/uL (ref 0.00–0.07)
Basophils Absolute: 0.1 10*3/uL (ref 0.0–0.1)
Basophils Relative: 0 %
Eosinophils Absolute: 0 10*3/uL (ref 0.0–0.5)
Eosinophils Relative: 0 %
HCT: 44.3 % (ref 36.0–46.0)
Hemoglobin: 14.5 g/dL (ref 12.0–15.0)
Immature Granulocytes: 0 %
Lymphocytes Relative: 30 %
Lymphs Abs: 3.8 10*3/uL (ref 0.7–4.0)
MCH: 30.4 pg (ref 26.0–34.0)
MCHC: 32.7 g/dL (ref 30.0–36.0)
MCV: 92.9 fL (ref 80.0–100.0)
Monocytes Absolute: 0.9 10*3/uL (ref 0.1–1.0)
Monocytes Relative: 7 %
Neutro Abs: 7.9 10*3/uL — ABNORMAL HIGH (ref 1.7–7.7)
Neutrophils Relative %: 63 %
Platelets: 272 10*3/uL (ref 150–400)
RBC: 4.77 MIL/uL (ref 3.87–5.11)
RDW: 12.2 % (ref 11.5–15.5)
WBC: 12.7 10*3/uL — ABNORMAL HIGH (ref 4.0–10.5)
nRBC: 0 % (ref 0.0–0.2)

## 2023-05-09 LAB — COMPREHENSIVE METABOLIC PANEL
ALT: 27 U/L (ref 0–44)
AST: 22 U/L (ref 15–41)
Albumin: 4.3 g/dL (ref 3.5–5.0)
Alkaline Phosphatase: 55 U/L (ref 38–126)
Anion gap: 9 (ref 5–15)
BUN: 18 mg/dL (ref 6–20)
CO2: 23 mmol/L (ref 22–32)
Calcium: 9.7 mg/dL (ref 8.9–10.3)
Chloride: 106 mmol/L (ref 98–111)
Creatinine, Ser: 0.6 mg/dL (ref 0.44–1.00)
GFR, Estimated: >60 mL/min (ref >60)
Glucose, Bld: 98 mg/dL (ref 70–99)
Potassium: 3.7 mmol/L (ref 3.5–5.1)
Sodium: 138 mmol/L (ref 135–145)
Total Bilirubin: 0.6 mg/dL (ref 0.3–1.2)
Total Protein: 7.4 g/dL (ref 6.5–8.1)

## 2023-05-09 LAB — LIPID PANEL
Cholesterol: 148 mg/dL (ref 0–200)
HDL: 79 mg/dL (ref >40)
LDL Cholesterol: 49 mg/dL (ref 0–99)
Total CHOL/HDL Ratio: 1.9 RATIO
Triglycerides: 101 mg/dL (ref <150)
VLDL: 20 mg/dL (ref 0–40)

## 2023-05-09 LAB — VITAMIN D 25 HYDROXY (VIT D DEFICIENCY, FRACTURES): Vit D, 25-Hydroxy: 16.29 ng/mL — ABNORMAL LOW (ref 30–100)

## 2023-05-09 LAB — T4, FREE: Free T4: 0.67 ng/dL (ref 0.61–1.12)

## 2023-05-09 LAB — TSH: TSH: 3.746 u[IU]/mL (ref 0.350–4.500)

## 2023-05-09 LAB — VITAMIN B12: Vitamin B-12: 251 pg/mL (ref 180–914)

## 2023-05-09 LAB — FOLATE: Folate: 14.3 ng/mL (ref >5.9)

## 2023-05-13 ENCOUNTER — Telehealth: Payer: Self-pay

## 2023-05-13 ENCOUNTER — Other Ambulatory Visit: Payer: Self-pay | Admitting: Nurse Practitioner

## 2023-05-13 MED ORDER — VITAMIN D (ERGOCALCIFEROL) 1.25 MG (50000 UNIT) PO CAPS: 50000.0000 [IU] | ORAL_CAPSULE | ORAL | 3 refills | Status: DC

## 2023-05-13 NOTE — Telephone Encounter (Signed)
Left message for patient to give the office a call

## 2023-05-13 NOTE — Telephone Encounter (Addendum)
Spoke with patient she understood and she will call back once she is able to get a ride to schedule her b12 injections.

## 2023-05-13 NOTE — Progress Notes (Signed)
Low vitamin D -- I ordered weekly supplement, will probably continue for 6-12 months  CBC -- white blood cells are elevated but she was having a sinus infection recently which is most likely why it is elevated.  Low vitamin B12 -- needs B12 injections weekly for 3 weeks then monthly for 5 months, have her schedule a nurse visit. We will recheck level in 6 months  CMP -- normal Folate and thyroid levels are normal Cholesterol is greatly improved and in normal range

## 2023-05-13 NOTE — Telephone Encounter (Signed)
-----   Message from Sallyanne Kuster, NP sent at 05/13/2023  8:23 AM EDT ----- Low vitamin D -- I ordered weekly supplement, will probably continue for 6-12 months  CBC -- white blood cells are elevated but she was having a sinus infection recently which is most likely why it is elevated.  Low vitamin B12 -- needs B12 injections weekly for 3 weeks then monthly for 5 months, have her schedule a nurse visit. We will recheck level in 6 months  CMP -- normal Folate and thyroid levels are normal Cholesterol is greatly improved and in normal range

## 2023-05-20 ENCOUNTER — Telehealth: Payer: Self-pay

## 2023-05-20 DIAGNOSIS — J0101 Acute recurrent maxillary sinusitis: Secondary | ICD-10-CM

## 2023-05-20 MED ORDER — LEVOFLOXACIN 500 MG PO TABS: 500.0000 mg | ORAL_TABLET | Freq: Every day | ORAL | 0 refills | Status: AC

## 2023-05-20 NOTE — Telephone Encounter (Signed)
Patient called back, stating she hasn't heard from anyone since she called this morning, spoke with Alyssa and she gave her levofloxacin and patient was notified that medicine was sent in for her.

## 2023-05-20 NOTE — Telephone Encounter (Signed)
Sinusitis has not resolved with augmentin or doxycycline, will try levofloxacin.

## 2023-05-26 ENCOUNTER — Ambulatory Visit (INDEPENDENT_AMBULATORY_CARE_PROVIDER_SITE_OTHER): Payer: MEDICAID

## 2023-05-26 DIAGNOSIS — E538 Deficiency of other specified B group vitamins: Secondary | ICD-10-CM

## 2023-05-26 MED ORDER — CYANOCOBALAMIN 1000 MCG/ML IJ SOLN
1000.00 ug | Freq: Once | INTRAMUSCULAR | Status: AC
Start: 2023-05-26 — End: 2023-05-26
  Administered 2023-05-26: 1000 ug via INTRAMUSCULAR

## 2023-05-27 ENCOUNTER — Telehealth: Payer: Self-pay | Admitting: Nurse Practitioner

## 2023-05-27 NOTE — Telephone Encounter (Signed)
Dermatology referral redirected to [facility] due to [facility]

## 2023-05-27 NOTE — Telephone Encounter (Signed)
Lvm and sent message regarding dermatology referral-Toni 

## 2023-06-04 ENCOUNTER — Ambulatory Visit (INDEPENDENT_AMBULATORY_CARE_PROVIDER_SITE_OTHER): Payer: MEDICAID

## 2023-06-04 DIAGNOSIS — E538 Deficiency of other specified B group vitamins: Secondary | ICD-10-CM

## 2023-06-04 MED ORDER — CYANOCOBALAMIN 1000 MCG/ML IJ SOLN
1000.0000 ug | Freq: Once | INTRAMUSCULAR | Status: AC
Start: 2023-06-04 — End: 2023-06-04
  Administered 2023-06-04: 1000 ug via INTRAMUSCULAR

## 2023-06-13 ENCOUNTER — Ambulatory Visit (INDEPENDENT_AMBULATORY_CARE_PROVIDER_SITE_OTHER): Payer: MEDICAID

## 2023-06-13 ENCOUNTER — Ambulatory Visit
Admission: RE | Admit: 2023-06-13 | Discharge: 2023-06-13 | Disposition: A | Discharge status: Home / Self Care | Payer: MEDICAID | Source: Ambulatory Visit | Attending: Nurse Practitioner

## 2023-06-13 DIAGNOSIS — E538 Deficiency of other specified B group vitamins: Secondary | ICD-10-CM | POA: Diagnosis not present

## 2023-06-13 DIAGNOSIS — F17210 Nicotine dependence, cigarettes, uncomplicated: Secondary | ICD-10-CM

## 2023-06-13 MED ORDER — CYANOCOBALAMIN 1000 MCG/ML IJ SOLN
1000.0000 ug | Freq: Once | INTRAMUSCULAR | Status: AC
Start: 2023-06-13 — End: 2023-06-13
  Administered 2023-06-13: 1000 ug via INTRAMUSCULAR

## 2023-06-17 ENCOUNTER — Telehealth: Payer: Self-pay | Admitting: Nurse Practitioner

## 2023-06-17 NOTE — Telephone Encounter (Signed)
Dermatology appointment 07/28/2023 @ [facility]-Toni

## 2023-06-25 ENCOUNTER — Encounter: Payer: Self-pay | Admitting: Nurse Practitioner

## 2023-06-30 ENCOUNTER — Encounter: Payer: Self-pay | Admitting: Nurse Practitioner

## 2023-06-30 ENCOUNTER — Ambulatory Visit (INDEPENDENT_AMBULATORY_CARE_PROVIDER_SITE_OTHER): Payer: MEDICAID | Admitting: Nurse Practitioner

## 2023-06-30 VITALS — BP 120/76 | HR 80 | Temp 97.2°F | Resp 16 | Ht 63.0 in | Wt 135.2 lb

## 2023-06-30 DIAGNOSIS — R911 Solitary pulmonary nodule: Secondary | ICD-10-CM | POA: Diagnosis not present

## 2023-06-30 DIAGNOSIS — I700 Atherosclerosis of aorta: Secondary | ICD-10-CM | POA: Diagnosis not present

## 2023-06-30 DIAGNOSIS — J432 Centrilobular emphysema: Secondary | ICD-10-CM | POA: Diagnosis not present

## 2023-06-30 MED ORDER — ROFLUMILAST 250 MCG PO TABS
1.0000 | ORAL_TABLET | Freq: Every day | ORAL | 0 refills | Status: DC
Start: 2023-06-30 — End: 2023-07-24

## 2023-06-30 MED ORDER — DULERA 100-5 MCG/ACT IN AERO
2.0000 | INHALATION_SPRAY | Freq: Two times a day (BID) | RESPIRATORY_TRACT | 5 refills | Status: DC
Start: 2023-06-30 — End: 2023-07-24

## 2023-06-30 NOTE — Progress Notes (Signed)
[facility] [address] [place], [place] [address]  Internal MEDICINE  Office Visit Note  Patient Name: Melody Moss  [id number]  [id number]  Date of Service: 06/30/2023  Chief Complaint  Patient presents with   Follow-up    Review CT.    HPI Melody Moss presents for a follow-up visit CT scan results.  CT chest results: Pulmonary nodules -- irregular subpleural nodular opacity w/45mm diamter, numerous smaller pulmonary nodules primarily in upper lungs all measuring under 4mm in diamter. Lung-RADS 3, probably benign, 6 month follow up CT scan recommended  Emphysema seen on CT scan  -- feels like airway is closing and she is not getting enough oxygen. Using combivent and symbicort now but does not seem to control symptoms well enough.  Aortic atherosclerosis -- mild calcified plaque. No coronary artery calcifications.     Current Medication: Outpatient Encounter Medications as of 06/30/2023  Medication Sig   amLODipine (NORVASC) 10 MG tablet TAKE 1 TABLET(10 MG) BY MOUTH DAILY   budesonide-formoterol (SYMBICORT) 160-4.5 MCG/ACT inhaler Inhale 2 puffs into the lungs 2 (two) times daily.   buPROPion (WELLBUTRIN XL) 150 MG 24 hr tablet TK 1 T PO QAM   carbamazepine (CARBATROL) 300 MG 12 hr capsule TK 2 [place] PO QHS   carvedilol (COREG) 6.25 MG tablet TAKE 1 TABLET(6.25 MG) BY MOUTH TWICE DAILY WITH A MEAL   celecoxib (CELEBREX) 100 MG capsule Take 1 capsule (100 mg total) by mouth 2 (two) times daily.   clindamycin (CLEOCIN T) 1 % external solution Apply 1 Application topically 2 (two) times daily.   diclofenac Sodium (VOLTAREN) 1 % GEL Apply 4 g topically 4 (four) times daily.   dicyclomine (BENTYL) 20 MG tablet Take by mouth.   doxycycline (VIBRA-TABS) 100 MG tablet Take 1 tablet (100 mg total) by mouth 2 (two) times daily.   Ipratropium-Albuterol (COMBIVENT) 20-100 MCG/ACT AERS respimat Inhale 1 puff into the lungs every 6 (six) hours.   LATUDA 60 MG TABS TK 1 T PO WITH DINNER    levothyroxine (SYNTHROID) 50 MCG tablet Take 1 tab po daily on an empty stomach   lidocaine (LIDODERM) 5 % Place 1 patch onto the skin daily. Remove & Discard patch within 12 hours or as directed by MD   mometasone-formoterol (DULERA) 100-5 MCG/ACT AERO Inhale 2 puffs into the lungs in the morning and at bedtime.   NUEDEXTA 20-10 MG CAPS TK ONE C PO BID FOR PSEUDOBULBAR EFFECT   omeprazole (PRILOSEC) 40 MG capsule TAKE 1 CAPSULE(40 MG) BY MOUTH DAILY   pantoprazole (PROTONIX) 40 MG tablet Take 1 tablet (40 mg total) by mouth daily.   predniSONE (STERAPRED UNI-PAK 21 TAB) 10 MG (21) TBPK tablet Use as directed for 6 days   Roflumilast (DALIRESP) 250 MCG TABS Take 1 tablet by mouth daily.   rosuvastatin (CRESTOR) 5 MG tablet TAKE 1 TABLET BY MOUTH DAILY   Vitamin D, Ergocalciferol, (DRISDOL) 1.25 MG (50000 UNIT) CAPS capsule Take 1 capsule (50,000 Units total) by mouth every 7 (seven) days.   No facility-administered encounter medications on file as of 06/30/2023.    Surgical History: Past Surgical History:  Procedure Laterality Date   COSMETIC SURGERY     LAPAROSCOPIC OOPHORECTOMY     few  times   TONSILLECTOMY Bilateral     Medical History: Past Medical History:  Diagnosis Date   Bipolar 1 disorder (HCC)    Depression    Hypertension    Panic attack     Family History:  Family History  Adopted: Yes    Social History   Socioeconomic History   Marital status: Single    Spouse name: Not on file   Number of children: Not on file   Years of education: Not on file   Highest education level: Not on file  Occupational History   Not on file  Tobacco Use   Smoking status: Every Day    Current packs/day: 1.00    Types: Cigarettes   Smokeless tobacco: Never  Substance and Sexual Activity   Alcohol use: Yes    Comment: ocassionally    Drug use: No   Sexual activity: Not on file  Other Topics Concern   Not on file  Social History Narrative   Not on file   Social  Determinants of Health   Financial Resource Strain: Not on file  Food Insecurity: Not on file  Transportation Needs: Not on file  Physical Activity: Not on file  Stress: Not on file  Social Connections: Not on file  Intimate Partner Violence: Not on file      Review of Systems  Constitutional:  Negative for chills, fatigue, fever and unexpected weight change.  HENT:  Negative for congestion, rhinorrhea, sneezing and sore throat.   Eyes:  Negative for redness.  Respiratory:  Positive for cough, shortness of breath and wheezing. Negative for chest tightness.   Cardiovascular:  Negative for chest pain and palpitations.  Gastrointestinal:  Negative for abdominal pain, constipation, diarrhea, nausea and vomiting.  Genitourinary:  Negative for dysuria and frequency.  Musculoskeletal:  Positive for arthralgias, back pain, neck pain and neck stiffness. Negative for joint swelling.  Skin: Negative.  Negative for rash.  Neurological: Negative.  Negative for dizziness, tremors, light-headedness, numbness and headaches.  Hematological:  Negative for adenopathy. Does not bruise/bleed easily.  Psychiatric/Behavioral:  Positive for behavioral problems (Depression). Negative for self-injury, sleep disturbance and suicidal ideas. The patient is nervous/anxious.     Vital Signs: BP 120/76   Pulse 80   Temp (!) 97.2 F (36.2 C)   Resp 16   Ht 5\' 3"  (1.6 m)   Wt 135 lb 3.2 oz (61.3 kg)   SpO2 98%   BMI 23.95 kg/m    Physical Exam Vitals reviewed.  Constitutional:      General: She is not in acute distress.    Appearance: Normal appearance. She is not ill-appearing.  HENT:     Head: Normocephalic and atraumatic.  Eyes:     Pupils: Pupils are equal, round, and reactive to light.  Cardiovascular:     Rate and Rhythm: Normal rate and regular rhythm.  Pulmonary:     Effort: Pulmonary effort is normal. No respiratory distress.     Breath sounds: Normal breath sounds.  Neurological:      Mental Status: She is alert and oriented to person, place, and time.  Psychiatric:        Mood and Affect: Mood normal.        Behavior: Behavior normal.        Assessment/Plan: 1. Solid nodule of lung 6 mm to 8 mm in diameter Repeat CT chest ordered to be done in 6 months  - CT CHEST NODULE FOLLOW UP LOW DOSE W/O; Future  2. Centrilobular emphysema (HCC) Discontinue symbicort, start dulera instead.  Start roflumilast once daily, will reassess at follow up visit and discuss increasing dose to 500 mcg which is target therapeutic dose. - mometasone-formoterol (DULERA) 100-5 MCG/ACT AERO; Inhale 2 puffs into the  lungs in the morning and at bedtime.  Dispense: 1 each; Refill: 5 - Roflumilast (DALIRESP) 250 MCG TABS; Take 1 tablet by mouth daily.  Dispense: 28 tablet; Refill: 0  3. Aortic atherosclerosis (HCC) Continue rosuvastatin 5 mg daily as prescribed.    General Counseling: Deniya verbalizes understanding of the findings of todays visit and agrees with plan of treatment. I have discussed any further diagnostic evaluation that may be needed or ordered today. We also reviewed her medications today. she has been encouraged to call the office with any questions or concerns that should arise related to todays visit.    Orders Placed This Encounter  Procedures   CT CHEST NODULE FOLLOW UP LOW DOSE W/O    Meds ordered this encounter  Medications   mometasone-formoterol (DULERA) 100-5 MCG/ACT AERO    Sig: Inhale 2 puffs into the lungs in the morning and at bedtime.    Dispense:  1 each    Refill:  5   Roflumilast (DALIRESP) 250 MCG TABS    Sig: Take 1 tablet by mouth daily.    Dispense:  28 tablet    Refill:  0    New medication, fill today    Return in about 4 weeks (around 07/28/2023) for F/U, eval new med COPD , Mycal Conde PCP.   Total time spent:30 Minutes Time spent includes review of chart, medications, test results, and follow up plan with the patient.   [place] Controlled  Substance Database was reviewed by me.  This patient was seen by Sallyanne Kuster, FNP-C in collaboration with Dr. Beverely Risen as a part of collaborative care agreement.   Korbyn Vanes R. Tedd Sias, MSN, FNP-C Internal medicine

## 2023-07-08 ENCOUNTER — Ambulatory Visit (INDEPENDENT_AMBULATORY_CARE_PROVIDER_SITE_OTHER): Payer: MEDICAID

## 2023-07-08 DIAGNOSIS — E538 Deficiency of other specified B group vitamins: Secondary | ICD-10-CM

## 2023-07-08 MED ORDER — CYANOCOBALAMIN 1000 MCG/ML IJ SOLN
1000.0000 ug | Freq: Once | INTRAMUSCULAR | Status: AC
Start: 2023-07-08 — End: 2023-07-08
  Administered 2023-07-08: 1000 ug via INTRAMUSCULAR

## 2023-07-10 ENCOUNTER — Ambulatory Visit: Payer: MEDICAID

## 2023-07-20 ENCOUNTER — Encounter: Payer: Self-pay | Admitting: Nurse Practitioner

## 2023-07-23 ENCOUNTER — Other Ambulatory Visit: Payer: Self-pay | Admitting: Nurse Practitioner

## 2023-07-23 DIAGNOSIS — J432 Centrilobular emphysema: Secondary | ICD-10-CM

## 2023-07-24 ENCOUNTER — Ambulatory Visit: Payer: MEDICAID | Admitting: Nurse Practitioner

## 2023-07-24 ENCOUNTER — Encounter: Payer: Self-pay | Admitting: Nurse Practitioner

## 2023-07-24 VITALS — BP 118/78 | HR 92 | Temp 96.2°F | Resp 16 | Ht 63.0 in | Wt 132.4 lb

## 2023-07-24 DIAGNOSIS — E162 Hypoglycemia, unspecified: Secondary | ICD-10-CM | POA: Diagnosis not present

## 2023-07-24 DIAGNOSIS — R7301 Impaired fasting glucose: Secondary | ICD-10-CM | POA: Diagnosis not present

## 2023-07-24 DIAGNOSIS — J432 Centrilobular emphysema: Secondary | ICD-10-CM

## 2023-07-24 DIAGNOSIS — Z9189 Other specified personal risk factors, not elsewhere classified: Secondary | ICD-10-CM

## 2023-07-24 DIAGNOSIS — E538 Deficiency of other specified B group vitamins: Secondary | ICD-10-CM | POA: Diagnosis not present

## 2023-07-24 MED ORDER — ROFLUMILAST 250 MCG PO TABS
2.0000 | ORAL_TABLET | Freq: Every day | ORAL | 3 refills | Status: DC
Start: 2023-07-24 — End: 2023-11-04

## 2023-07-24 MED ORDER — ACCU-CHEK GUIDE ME W/DEVICE KIT
1.0000 | PACK | Freq: Once | 0 refills | Status: AC
Start: 2023-07-24 — End: 2023-07-24

## 2023-07-24 MED ORDER — ACCU-CHEK GUIDE VI STRP
ORAL_STRIP | 12 refills | Status: AC
Start: 2023-07-24 — End: ?

## 2023-07-24 MED ORDER — ROFLUMILAST 250 MCG PO TABS
1.0000 | ORAL_TABLET | Freq: Every day | ORAL | 0 refills | Status: DC
Start: 2023-07-24 — End: 2023-07-24

## 2023-07-24 MED ORDER — BREZTRI AEROSPHERE 160-9-4.8 MCG/ACT IN AERO
2.0000 | INHALATION_SPRAY | Freq: Two times a day (BID) | RESPIRATORY_TRACT | 11 refills | Status: DC
Start: 2023-07-24 — End: 2024-05-20

## 2023-07-24 MED ORDER — CYANOCOBALAMIN 1000 MCG/ML IJ SOLN
1000.0000 ug | Freq: Once | INTRAMUSCULAR | Status: AC
Start: 2023-07-24 — End: 2023-07-24
  Administered 2023-07-24: 1000 ug via INTRAMUSCULAR

## 2023-07-24 MED ORDER — ACCU-CHEK SOFTCLIX LANCETS MISC
3 refills | Status: AC
Start: 2023-07-24 — End: ?

## 2023-07-24 NOTE — Progress Notes (Signed)
[facility] [address] [place], [place] [address]  Internal MEDICINE  Office Visit Note  Patient Name: Melody Moss  [id number]  [id number]  Date of Service: 07/24/2023  Chief Complaint  Patient presents with   Depression   Hypertension   Follow-up    Eval new med.     HPI Melody Moss presents for a follow-up visit for COPD, episodes of possible low sugars  Daliresp is helping but not enough, and it is time to increase the dose. Denies any side effects except maybe a headache.  Reports episodes of feeling fatigued, dizzy, "like she has the flu", irritable, hungry, thirsty, polyuria, and clammy. Does not know family hx due to being adopted. Has had prior elevated fasting glucose on labs before.  Emphysema -- was trying a dual therapy inhaler but it was not helping enough, was trying dulera. Insurance covers triple therapy.  Low B12 -- due for next B12 injection.    Current Medication: Outpatient Encounter Medications as of 07/24/2023  Medication Sig   Accu-Chek Softclix Lancets lancets Use 1 lancet to check fasting glucose once daily and as needed for diabetes   amLODipine (NORVASC) 10 MG tablet TAKE 1 TABLET(10 MG) BY MOUTH DAILY   [EXPIRED] Blood Glucose Monitoring Suppl (ACCU-CHEK GUIDE ME) w/Device KIT 1 kit by Does not apply route once for 1 dose.   Budeson-Glycopyrrol-Formoterol (BREZTRI AEROSPHERE) 160-9-4.8 MCG/ACT AERO Inhale 2 puffs into the lungs 2 (two) times daily.   buPROPion (WELLBUTRIN XL) 150 MG 24 hr tablet TK 1 T PO QAM   carbamazepine (CARBATROL) 300 MG 12 hr capsule TK 2 [place] PO QHS   celecoxib (CELEBREX) 100 MG capsule Take 1 capsule (100 mg total) by mouth 2 (two) times daily.   clindamycin (CLEOCIN T) 1 % external solution Apply 1 Application topically 2 (two) times daily.   dicyclomine (BENTYL) 20 MG tablet Take by mouth.   doxycycline (VIBRA-TABS) 100 MG tablet Take 1 tablet (100 mg total) by mouth 2 (two) times daily.   glucose blood (ACCU-CHEK GUIDE)  test strip Use 1 test strip to check fasting glucose once daily and as need for diabetes   Ipratropium-Albuterol (COMBIVENT) 20-100 MCG/ACT AERS respimat Inhale 1 puff into the lungs every 6 (six) hours.   LATUDA 60 MG TABS TK 1 T PO WITH DINNER   levothyroxine (SYNTHROID) 50 MCG tablet Take 1 tab po daily on an empty stomach   lidocaine (LIDODERM) 5 % Place 1 patch onto the skin daily. Remove & Discard patch within 12 hours or as directed by MD   NUEDEXTA 20-10 MG CAPS TK ONE C PO BID FOR PSEUDOBULBAR EFFECT   omeprazole (PRILOSEC) 40 MG capsule TAKE 1 CAPSULE(40 MG) BY MOUTH DAILY   pantoprazole (PROTONIX) 40 MG tablet Take 1 tablet (40 mg total) by mouth daily.   Vitamin D, Ergocalciferol, (DRISDOL) 1.25 MG (50000 UNIT) CAPS capsule Take 1 capsule (50,000 Units total) by mouth every 7 (seven) days.   [DISCONTINUED] budesonide-formoterol (SYMBICORT) 160-4.5 MCG/ACT inhaler Inhale 2 puffs into the lungs 2 (two) times daily.   [DISCONTINUED] carvedilol (COREG) 6.25 MG tablet TAKE 1 TABLET(6.25 MG) BY MOUTH TWICE DAILY WITH A MEAL   [DISCONTINUED] diclofenac Sodium (VOLTAREN) 1 % GEL Apply 4 g topically 4 (four) times daily.   [DISCONTINUED] mometasone-formoterol (DULERA) 100-5 MCG/ACT AERO Inhale 2 puffs into the lungs in the morning and at bedtime.   [DISCONTINUED] predniSONE (STERAPRED UNI-PAK 21 TAB) 10 MG (21) TBPK tablet Use as directed for 6 days   [  DISCONTINUED] Roflumilast (DALIRESP) 250 MCG TABS Take 1 tablet by mouth daily.   [DISCONTINUED] rosuvastatin (CRESTOR) 5 MG tablet TAKE 1 TABLET BY MOUTH DAILY   Roflumilast (DALIRESP) 250 MCG TABS Take 2 tablets by mouth daily. May decrease back to 1 tablet daily if not able to tolerate increased dose.   [DISCONTINUED] Roflumilast (DALIRESP) 250 MCG TABS Take 1 tablet by mouth daily.   [EXPIRED] cyanocobalamin (VITAMIN B12) injection 1,000 mcg    No facility-administered encounter medications on file as of 07/24/2023.    Surgical  History: Past Surgical History:  Procedure Laterality Date   COSMETIC SURGERY     LAPAROSCOPIC OOPHORECTOMY     few  times   TONSILLECTOMY Bilateral     Medical History: Past Medical History:  Diagnosis Date   Bipolar 1 disorder (HCC)    Depression    Hypertension    Panic attack     Family History: Family History  Adopted: Yes    Social History   Socioeconomic History   Marital status: Single    Spouse name: Not on file   Number of children: Not on file   Years of education: Not on file   Highest education level: Not on file  Occupational History   Not on file  Tobacco Use   Smoking status: Every Day    Current packs/day: 1.00    Types: Cigarettes   Smokeless tobacco: Never  Substance and Sexual Activity   Alcohol use: Yes    Comment: ocassionally    Drug use: No   Sexual activity: Not on file  Other Topics Concern   Not on file  Social History Narrative   Not on file   Social Determinants of Health   Financial Resource Strain: Not on file  Food Insecurity: Not on file  Transportation Needs: Not on file  Physical Activity: Not on file  Stress: Not on file  Social Connections: Not on file  Intimate Partner Violence: Not on file      Review of Systems  Constitutional:  Negative for chills, fatigue, fever and unexpected weight change.  HENT:  Negative for congestion, rhinorrhea, sneezing and sore throat.   Eyes:  Negative for redness.  Respiratory:  Positive for cough, shortness of breath and wheezing. Negative for chest tightness.   Cardiovascular:  Negative for chest pain and palpitations.  Gastrointestinal:  Negative for abdominal pain, constipation, diarrhea, nausea and vomiting.  Genitourinary:  Negative for dysuria and frequency.  Musculoskeletal:  Positive for arthralgias, back pain, neck pain and neck stiffness. Negative for joint swelling.  Skin: Negative.  Negative for rash.  Neurological: Negative.  Negative for dizziness, tremors,  light-headedness, numbness and headaches.  Hematological:  Negative for adenopathy. Does not bruise/bleed easily.  Psychiatric/Behavioral:  Positive for behavioral problems (Depression). Negative for self-injury, sleep disturbance and suicidal ideas. The patient is nervous/anxious.     Vital Signs: BP 118/78   Pulse 92   Temp (!) 96.2 F (35.7 C)   Resp 16   Ht 5\' 3"  (1.6 m)   Wt 132 lb 6.4 oz (60.1 kg)   SpO2 96%   BMI 23.45 kg/m    Physical Exam Vitals reviewed.  Constitutional:      General: She is not in acute distress.    Appearance: Normal appearance. She is not ill-appearing.  HENT:     Head: Normocephalic and atraumatic.  Eyes:     Pupils: Pupils are equal, round, and reactive to light.  Cardiovascular:  Rate and Rhythm: Normal rate and regular rhythm.  Pulmonary:     Effort: Pulmonary effort is normal. No respiratory distress.     Breath sounds: Normal breath sounds.  Neurological:     Mental Status: She is alert and oriented to person, place, and time.  Psychiatric:        Mood and Affect: Mood normal.        Behavior: Behavior normal.        Assessment/Plan: 1. Centrilobular emphysema (HCC) Daliresp dose increased. Breztri inhaler prescribed, samples given to patient. Dulera discontinued.  - Roflumilast (DALIRESP) 250 MCG TABS; Take 2 tablets by mouth daily. May decrease back to 1 tablet daily if not able to tolerate increased dose.  Dispense: 60 tablet; Refill: 3 - Budeson-Glycopyrrol-Formoterol (BREZTRI AEROSPHERE) 160-9-4.8 MCG/ACT AERO; Inhale 2 puffs into the lungs 2 (two) times daily.  Dispense: 10.7 g; Refill: 11  2. Impaired fasting glucose A1c in high normal range at 5.6  - POCT glycosylated hemoglobin (Hb A1C)  3. Multiple episodes of hypoglycemia A1c is borderline high normal range 5.6, glucose meter and supplies ordered to help patient monitor for possible low glucose levels at home  - POCT glycosylated hemoglobin (Hb A1C) - Blood  Glucose Monitoring Suppl (ACCU-CHEK GUIDE ME) w/Device KIT; 1 kit by Does not apply route once for 1 dose.  Dispense: 1 kit; Refill: 0 - Accu-Chek Softclix Lancets lancets; Use 1 lancet to check fasting glucose once daily and as needed for diabetes  Dispense: 100 each; Refill: 3 - glucose blood (ACCU-CHEK GUIDE) test strip; Use 1 test strip to check fasting glucose once daily and as need for diabetes  Dispense: 100 each; Refill: 12  4. B12 deficiency B12 injection administered in office today.  - cyanocobalamin (VITAMIN B12) injection 1,000 mcg  5. Potential for hypoglycemia A1c checked, was borderline high normal at 5.6 - POCT glycosylated hemoglobin (Hb A1C)   General Counseling: Brayah verbalizes understanding of the findings of todays visit and agrees with plan of treatment. I have discussed any further diagnostic evaluation that may be needed or ordered today. We also reviewed her medications today. she has been encouraged to call the office with any questions or concerns that should arise related to todays visit.    Orders Placed This Encounter  Procedures   POCT glycosylated hemoglobin (Hb A1C)    Meds ordered this encounter  Medications   cyanocobalamin (VITAMIN B12) injection 1,000 mcg   DISCONTD: Roflumilast (DALIRESP) 250 MCG TABS    Sig: Take 1 tablet by mouth daily.    Dispense:  28 tablet    Refill:  0    New medication, fill today   Blood Glucose Monitoring Suppl (ACCU-CHEK GUIDE ME) w/Device KIT    Sig: 1 kit by Does not apply route once for 1 dose.    Dispense:  1 kit    Refill:  0    Dx code E16.2 and R73.01   Accu-Chek Softclix Lancets lancets    Sig: Use 1 lancet to check fasting glucose once daily and as needed for diabetes    Dispense:  100 each    Refill:  3    Dx code E16.2 and R73.01   glucose blood (ACCU-CHEK GUIDE) test strip    Sig: Use 1 test strip to check fasting glucose once daily and as need for diabetes    Dispense:  100 each    Refill:  12     Dx code E16.2 and R73.01   Roflumilast (  DALIRESP) 250 MCG TABS    Sig: Take 2 tablets by mouth daily. May decrease back to 1 tablet daily if not able to tolerate increased dose.    Dispense:  60 tablet    Refill:  3    Note increased dose, please fill today asap   Budeson-Glycopyrrol-Formoterol (BREZTRI AEROSPHERE) 160-9-4.8 MCG/ACT AERO    Sig: Inhale 2 puffs into the lungs 2 (two) times daily.    Dispense:  10.7 g    Refill:  11    Dx code J44.9; has tried and failed symbicort and dulera.    Return in about 1 month (around 08/23/2023) for F/U, Bradford Cazier PCP, eval new med and sugar log.   Total time spent:30 Minutes Time spent includes review of chart, medications, test results, and follow up plan with the patient.   [place] Controlled Substance Database was reviewed by me.  This patient was seen by Sallyanne Kuster, FNP-C in collaboration with Dr. Beverely Risen as a part of collaborative care agreement.   Maday Guarino R. Tedd Sias, MSN, FNP-C Internal medicine

## 2023-07-28 ENCOUNTER — Ambulatory Visit: Payer: MEDICAID | Admitting: Nurse Practitioner

## 2023-08-01 ENCOUNTER — Ambulatory Visit
Admission: RE | Admit: 2023-08-01 | Discharge: 2023-08-01 | Disposition: A | Discharge status: Home / Self Care | Payer: MEDICAID | Source: Ambulatory Visit | Attending: Nurse Practitioner | Admitting: Nurse Practitioner

## 2023-08-01 DIAGNOSIS — Z0001 Encounter for general adult medical examination with abnormal findings: Secondary | ICD-10-CM

## 2023-08-01 DIAGNOSIS — Z1231 Encounter for screening mammogram for malignant neoplasm of breast: Secondary | ICD-10-CM | POA: Insufficient documentation

## 2023-08-05 ENCOUNTER — Other Ambulatory Visit: Payer: Self-pay | Admitting: Nurse Practitioner

## 2023-08-05 ENCOUNTER — Other Ambulatory Visit: Payer: Self-pay

## 2023-08-05 DIAGNOSIS — Z0001 Encounter for general adult medical examination with abnormal findings: Secondary | ICD-10-CM

## 2023-08-05 DIAGNOSIS — E782 Mixed hyperlipidemia: Secondary | ICD-10-CM

## 2023-08-05 MED ORDER — CARVEDILOL 6.25 MG PO TABS: ORAL_TABLET | ORAL | 1 refills | Status: DC

## 2023-08-28 ENCOUNTER — Ambulatory Visit: Payer: MEDICAID | Admitting: Nurse Practitioner

## 2023-09-03 ENCOUNTER — Encounter: Payer: Self-pay | Admitting: Nurse Practitioner

## 2023-09-03 ENCOUNTER — Ambulatory Visit: Payer: MEDICAID | Admitting: Nurse Practitioner

## 2023-09-03 VITALS — BP 114/76 | HR 79 | Temp 98.4°F | Resp 16 | Ht 63.0 in | Wt 132.2 lb

## 2023-09-03 DIAGNOSIS — E538 Deficiency of other specified B group vitamins: Secondary | ICD-10-CM | POA: Diagnosis not present

## 2023-09-03 DIAGNOSIS — Z9189 Other specified personal risk factors, not elsewhere classified: Secondary | ICD-10-CM

## 2023-09-03 DIAGNOSIS — M064 Inflammatory polyarthropathy: Secondary | ICD-10-CM | POA: Diagnosis not present

## 2023-09-03 DIAGNOSIS — Z760 Encounter for issue of repeat prescription: Secondary | ICD-10-CM

## 2023-09-03 DIAGNOSIS — M546 Pain in thoracic spine: Secondary | ICD-10-CM

## 2023-09-03 DIAGNOSIS — M5412 Radiculopathy, cervical region: Secondary | ICD-10-CM

## 2023-09-03 DIAGNOSIS — I700 Atherosclerosis of aorta: Secondary | ICD-10-CM

## 2023-09-03 DIAGNOSIS — M542 Cervicalgia: Secondary | ICD-10-CM | POA: Diagnosis not present

## 2023-09-03 DIAGNOSIS — G8929 Other chronic pain: Secondary | ICD-10-CM

## 2023-09-03 DIAGNOSIS — J0100 Acute maxillary sinusitis, unspecified: Secondary | ICD-10-CM

## 2023-09-03 LAB — POCT GLYCOSYLATED HEMOGLOBIN (HGB A1C): Hemoglobin A1C: 5.6 % (ref 4.0–5.6)

## 2023-09-03 MED ORDER — DICLOFENAC SODIUM 1 % EX GEL: 4.0000 g | Freq: Four times a day (QID) | CUTANEOUS | 2 refills | Status: DC

## 2023-09-03 MED ORDER — PREDNISONE 10 MG (21) PO TBPK
ORAL_TABLET | ORAL | 0 refills | Status: DC
Start: 2023-09-03 — End: 2023-10-14

## 2023-09-03 MED ORDER — CYANOCOBALAMIN 1000 MCG/ML IJ SOLN
1000.0000 ug | Freq: Once | INTRAMUSCULAR | Status: AC
Start: 2023-09-03 — End: 2023-09-03
  Administered 2023-09-03: 1000 ug via INTRAMUSCULAR

## 2023-09-03 NOTE — Progress Notes (Signed)
[facility] [address] [place], [place] [address]  Internal MEDICINE  Office Visit Note  Patient Name: Melody Moss  [id number]  [id number]  Date of Service: 09/03/2023  Chief Complaint  Patient presents with   Depression   Hypertension   Follow-up    Eval new med/sugar log    HPI Melody Moss presents for a follow-up visit for neck pain, sugars, COPD.  Glucose has been ok at home, nonfasting. Borderline normal A1c. No hypoglycemic episodes Tolerating increased dose daliresp -- breathing is slightly improved per patient  Tolerating breztri which is helping. -- breathing improving Was previously seeing Dr. Mariah Milling for treatment of chronic neck pain and cervical radiculopathy but it has been over a year since she has seen her and she needs a new referral.    Current Medication: Outpatient Encounter Medications as of 09/03/2023  Medication Sig   Accu-Chek Softclix Lancets lancets Use 1 lancet to check fasting glucose once daily and as needed for diabetes   amLODipine (NORVASC) 10 MG tablet TAKE 1 TABLET(10 MG) BY MOUTH DAILY   Budeson-Glycopyrrol-Formoterol (BREZTRI AEROSPHERE) 160-9-4.8 MCG/ACT AERO Inhale 2 puffs into the lungs 2 (two) times daily.   buPROPion (WELLBUTRIN XL) 150 MG 24 hr tablet TK 1 T PO QAM   carbamazepine (CARBATROL) 300 MG 12 hr capsule TK 2 [place] PO QHS   carvedilol (COREG) 6.25 MG tablet TAKE 1 TABLET(6.25 MG) BY MOUTH TWICE DAILY WITH A MEAL   celecoxib (CELEBREX) 100 MG capsule Take 1 capsule (100 mg total) by mouth 2 (two) times daily.   clindamycin (CLEOCIN T) 1 % external solution Apply 1 Application topically 2 (two) times daily.   dicyclomine (BENTYL) 20 MG tablet Take by mouth.   doxycycline (VIBRA-TABS) 100 MG tablet Take 1 tablet (100 mg total) by mouth 2 (two) times daily.   glucose blood (ACCU-CHEK GUIDE) test strip Use 1 test strip to check fasting glucose once daily and as need for diabetes   Ipratropium-Albuterol (COMBIVENT) 20-100  MCG/ACT AERS respimat Inhale 1 puff into the lungs every 6 (six) hours.   LATUDA 60 MG TABS TK 1 T PO WITH DINNER   levothyroxine (SYNTHROID) 50 MCG tablet Take 1 tab po daily on an empty stomach   lidocaine (LIDODERM) 5 % Place 1 patch onto the skin daily. Remove & Discard patch within 12 hours or as directed by MD   NUEDEXTA 20-10 MG CAPS TK ONE C PO BID FOR PSEUDOBULBAR EFFECT   Roflumilast (DALIRESP) 250 MCG TABS Take 2 tablets by mouth daily. May decrease back to 1 tablet daily if not able to tolerate increased dose.   rosuvastatin (CRESTOR) 5 MG tablet TAKE 1 TABLET BY MOUTH DAILY   Vitamin D, Ergocalciferol, (DRISDOL) 1.25 MG (50000 UNIT) CAPS capsule Take 1 capsule (50,000 Units total) by mouth every 7 (seven) days.   [DISCONTINUED] diclofenac Sodium (VOLTAREN) 1 % GEL Apply 4 g topically 4 (four) times daily.   [DISCONTINUED] omeprazole (PRILOSEC) 40 MG capsule TAKE 1 CAPSULE(40 MG) BY MOUTH DAILY   [DISCONTINUED] pantoprazole (PROTONIX) 40 MG tablet Take 1 tablet (40 mg total) by mouth daily.   [DISCONTINUED] predniSONE (STERAPRED UNI-PAK 21 TAB) 10 MG (21) TBPK tablet Use as directed for 6 days   diclofenac Sodium (VOLTAREN) 1 % GEL Apply 4 g topically 4 (four) times daily.   predniSONE (STERAPRED UNI-PAK 21 TAB) 10 MG (21) TBPK tablet Use as directed for 6 days   [EXPIRED] cyanocobalamin (VITAMIN B12) injection 1,000 mcg  No facility-administered encounter medications on file as of 09/03/2023.    Surgical History: Past Surgical History:  Procedure Laterality Date   COSMETIC SURGERY     LAPAROSCOPIC OOPHORECTOMY     few  times   TONSILLECTOMY Bilateral     Medical History: Past Medical History:  Diagnosis Date   Bipolar 1 disorder (HCC)    Depression    Hypertension    Panic attack     Family History: Family History  Adopted: Yes    Social History   Socioeconomic History   Marital status: Single    Spouse name: Not on file   Number of children: Not on file    Years of education: Not on file   Highest education level: Not on file  Occupational History   Not on file  Tobacco Use   Smoking status: Every Day    Current packs/day: 1.00    Types: Cigarettes   Smokeless tobacco: Never  Substance and Sexual Activity   Alcohol use: Yes    Comment: ocassionally    Drug use: No   Sexual activity: Not on file  Other Topics Concern   Not on file  Social History Narrative   Not on file   Social Determinants of Health   Financial Resource Strain: Not on file  Food Insecurity: Not on file  Transportation Needs: Not on file  Physical Activity: Not on file  Stress: Not on file  Social Connections: Not on file  Intimate Partner Violence: Not on file      Review of Systems  Constitutional:  Negative for chills, fatigue, fever and unexpected weight change.  HENT:  Negative for congestion, rhinorrhea, sneezing and sore throat.   Eyes:  Negative for redness.  Respiratory:  Positive for cough and shortness of breath (improving). Negative for chest tightness and wheezing.   Cardiovascular:  Negative for chest pain and palpitations.  Gastrointestinal:  Negative for abdominal pain, constipation, diarrhea, nausea and vomiting.  Genitourinary:  Negative for dysuria and frequency.  Musculoskeletal:  Positive for arthralgias, back pain, neck pain and neck stiffness. Negative for joint swelling.  Skin: Negative.  Negative for rash.  Neurological: Negative.  Negative for dizziness, tremors, light-headedness, numbness and headaches.  Hematological:  Negative for adenopathy. Does not bruise/bleed easily.  Psychiatric/Behavioral:  Positive for behavioral problems (Depression). Negative for self-injury, sleep disturbance and suicidal ideas. The patient is nervous/anxious.     Vital Signs: BP 114/76   Pulse 79   Temp 98.4 F (36.9 C)   Resp 16   Ht 5\' 3"  (1.6 m)   Wt 132 lb 3.2 oz (60 kg)   SpO2 99%   BMI 23.42 kg/m    Physical Exam Vitals  reviewed.  Constitutional:      General: She is not in acute distress.    Appearance: Normal appearance. She is normal weight. She is not ill-appearing.  HENT:     Head: Normocephalic and atraumatic.  Eyes:     Pupils: Pupils are equal, round, and reactive to light.  Cardiovascular:     Rate and Rhythm: Normal rate and regular rhythm.  Pulmonary:     Effort: Pulmonary effort is normal. No respiratory distress.     Breath sounds: Normal breath sounds. No wheezing.  Musculoskeletal:     Cervical back: Tenderness present.  Lymphadenopathy:     Cervical: No cervical adenopathy.  Neurological:     Mental Status: She is alert and oriented to person, place, and time.  Psychiatric:  Mood and Affect: Mood normal.        Behavior: Behavior normal.        Assessment/Plan: 1. Cervical radiculopathy Referred to Dr. Mariah Milling. Prednisone taper prescribed to help with inflammation and pain  - Ambulatory referral to Physical Medicine Rehab - predniSONE (STERAPRED UNI-PAK 21 TAB) 10 MG (21) TBPK tablet; Use as directed for 6 days  Dispense: 21 tablet; Refill: 0  2. Cervicalgia Referred to Dr. Mariah Milling, steroid pack prescribed to decrease inflammation  - Ambulatory referral to Physical Medicine Rehab - predniSONE (STERAPRED UNI-PAK 21 TAB) 10 MG (21) TBPK tablet; Use as directed for 6 days  Dispense: 21 tablet; Refill: 0  3. Chronic thoracic spine pain Referred for treatment with Dr. Mariah Milling  - Ambulatory referral to Physical Medicine Rehab  4. Inflammatory polyarthritis (HCC) Continue use of topical diclofenac as needed  - diclofenac Sodium (VOLTAREN) 1 % GEL; Apply 4 g topically 4 (four) times daily.  Dispense: 100 g; Refill: 2  5. B12 deficiency B12 injection administered in office today  - cyanocobalamin (VITAMIN B12) injection 1,000 mcg   General Counseling: Yalda verbalizes understanding of the findings of todays visit and agrees with plan of treatment. I have discussed any  further diagnostic evaluation that may be needed or ordered today. We also reviewed her medications today. she has been encouraged to call the office with any questions or concerns that should arise related to todays visit.    Orders Placed This Encounter  Procedures   Ambulatory referral to Physical Medicine Rehab    Meds ordered this encounter  Medications   diclofenac Sodium (VOLTAREN) 1 % GEL    Sig: Apply 4 g topically 4 (four) times daily.    Dispense:  100 g    Refill:  2   predniSONE (STERAPRED UNI-PAK 21 TAB) 10 MG (21) TBPK tablet    Sig: Use as directed for 6 days    Dispense:  21 tablet    Refill:  0   cyanocobalamin (VITAMIN B12) injection 1,000 mcg    Return for previously scheduled, Zimir Kittleson PCP in december.   Total time spent:30 Minutes Time spent includes review of chart, medications, test results, and follow up plan with the patient.   [place] Controlled Substance Database was reviewed by me.  This patient was seen by Sallyanne Kuster, FNP-C in collaboration with Dr. Beverely Risen as a part of collaborative care agreement.   Rajesh Wyss R. Tedd Sias, MSN, FNP-C Internal medicine

## 2023-09-04 ENCOUNTER — Telehealth: Payer: Self-pay | Admitting: Nurse Practitioner

## 2023-09-04 NOTE — Telephone Encounter (Signed)
Awaiting 09/03/23 office notes for Urgent Physical Rehab referral-Toni

## 2023-09-06 ENCOUNTER — Encounter: Payer: Self-pay | Admitting: Nurse Practitioner

## 2023-09-08 ENCOUNTER — Other Ambulatory Visit: Payer: Self-pay | Admitting: Nurse Practitioner

## 2023-09-08 DIAGNOSIS — Z760 Encounter for issue of repeat prescription: Secondary | ICD-10-CM

## 2023-09-17 ENCOUNTER — Telehealth: Payer: Self-pay | Admitting: Nurse Practitioner

## 2023-09-17 ENCOUNTER — Encounter: Payer: Self-pay | Admitting: Nurse Practitioner

## 2023-09-17 NOTE — Telephone Encounter (Signed)
Urgent General Surgery referral sent via [facility] to [facility]. Notified patient. Gave pt telephone # [phone]) [phone]-Toni

## 2023-09-29 ENCOUNTER — Telehealth: Payer: Self-pay | Admitting: Nurse Practitioner

## 2023-09-29 NOTE — Telephone Encounter (Signed)
General Surgery appointment 10/10/2023 @ [facility]-Toni

## 2023-10-14 ENCOUNTER — Telehealth: Payer: Self-pay

## 2023-10-14 DIAGNOSIS — M5412 Radiculopathy, cervical region: Secondary | ICD-10-CM

## 2023-10-14 DIAGNOSIS — M542 Cervicalgia: Secondary | ICD-10-CM

## 2023-10-14 MED ORDER — PREDNISONE 10 MG (21) PO TBPK
ORAL_TABLET | ORAL | 0 refills | Status: DC
Start: 2023-10-14 — End: 2023-11-04

## 2023-10-14 MED ORDER — DOXYCYCLINE HYCLATE 100 MG PO TABS: 100.0000 mg | ORAL_TABLET | Freq: Two times a day (BID) | ORAL | 0 refills | Status: AC

## 2023-10-14 NOTE — Telephone Encounter (Signed)
Patient notified

## 2023-11-04 ENCOUNTER — Encounter: Payer: Self-pay | Admitting: Nurse Practitioner

## 2023-11-04 ENCOUNTER — Ambulatory Visit: Payer: MEDICAID | Admitting: Nurse Practitioner

## 2023-11-04 VITALS — BP 110/76 | HR 83 | Temp 98.6°F | Resp 16 | Ht 63.0 in | Wt 127.4 lb

## 2023-11-04 DIAGNOSIS — E782 Mixed hyperlipidemia: Secondary | ICD-10-CM

## 2023-11-04 DIAGNOSIS — R0602 Shortness of breath: Secondary | ICD-10-CM

## 2023-11-04 DIAGNOSIS — I10 Essential (primary) hypertension: Secondary | ICD-10-CM | POA: Diagnosis not present

## 2023-11-04 DIAGNOSIS — J0100 Acute maxillary sinusitis, unspecified: Secondary | ICD-10-CM

## 2023-11-04 DIAGNOSIS — E538 Deficiency of other specified B group vitamins: Secondary | ICD-10-CM

## 2023-11-04 DIAGNOSIS — J432 Centrilobular emphysema: Secondary | ICD-10-CM

## 2023-11-04 DIAGNOSIS — E039 Hypothyroidism, unspecified: Secondary | ICD-10-CM

## 2023-11-04 MED ORDER — ROFLUMILAST 250 MCG PO TABS: 2.0000 | ORAL_TABLET | Freq: Every day | ORAL | 3 refills | Status: DC

## 2023-11-04 MED ORDER — IPRATROPIUM-ALBUTEROL 20-100 MCG/ACT IN AERS
1.0000 | INHALATION_SPRAY | Freq: Four times a day (QID) | RESPIRATORY_TRACT | 3 refills | Status: DC
Start: 2023-11-04 — End: 2024-03-03

## 2023-11-04 MED ORDER — AMLODIPINE BESYLATE 10 MG PO TABS: 10.0000 mg | ORAL_TABLET | Freq: Every day | ORAL | 3 refills | Status: DC

## 2023-11-04 MED ORDER — PREDNISONE 10 MG (21) PO TBPK
ORAL_TABLET | ORAL | 0 refills | Status: DC
Start: 2023-11-04 — End: 2024-05-20

## 2023-11-04 MED ORDER — LEVOTHYROXINE SODIUM 50 MCG PO TABS: ORAL_TABLET | ORAL | 3 refills | Status: AC

## 2023-11-04 MED ORDER — ROSUVASTATIN CALCIUM 5 MG PO TABS
5.0000 mg | ORAL_TABLET | Freq: Every day | ORAL | 1 refills | Status: DC
Start: 2023-11-04 — End: 2024-05-20

## 2023-11-04 MED ORDER — AMOXICILLIN-POT CLAVULANATE 875-125 MG PO TABS
1.0000 | ORAL_TABLET | Freq: Two times a day (BID) | ORAL | 0 refills | Status: AC
Start: 2023-11-04 — End: 2023-11-14

## 2023-11-04 NOTE — Progress Notes (Signed)
[facility] [address] [place], [place] [address]  Internal MEDICINE  Office Visit Note  Patient Name: Melody Moss  [id number]  [id number]  Date of Service: 11/04/2023  Chief Complaint  Patient presents with   Depression   Hypertension   Follow-up    HPI Melody Moss presents for a follow-up visit for sinus infection, refills, emphysema, hyperlipidemia, and B12 deficiency.  Respiratory infection -- treated with doxycycline and prednisone a few weeks ago, still having some symptoms, sputum is still yellow.  Due for multiple refills Emphysema -- taking roflumilast Hyperlipidemia -- taking rosuvastatin B12 deficiency -- has been receiving monthly B12 injections for several months. Will repeat the labs.     Current Medication: Outpatient Encounter Medications as of 11/04/2023  Medication Sig   Accu-Chek Softclix Lancets lancets Use 1 lancet to check fasting glucose once daily and as needed for diabetes   amoxicillin-clavulanate (AUGMENTIN) 875-125 MG tablet Take 1 tablet by mouth 2 (two) times daily for 10 days. Take with food   Budeson-Glycopyrrol-Formoterol (BREZTRI AEROSPHERE) 160-9-4.8 MCG/ACT AERO Inhale 2 puffs into the lungs 2 (two) times daily.   buPROPion (WELLBUTRIN XL) 150 MG 24 hr tablet TK 1 T PO QAM   carbamazepine (CARBATROL) 300 MG 12 hr capsule TK 2 [place] PO QHS   carvedilol (COREG) 6.25 MG tablet TAKE 1 TABLET(6.25 MG) BY MOUTH TWICE DAILY WITH A MEAL   clindamycin (CLEOCIN T) 1 % external solution Apply 1 Application topically 2 (two) times daily.   diclofenac Sodium (VOLTAREN) 1 % GEL Apply 4 g topically 4 (four) times daily.   dicyclomine (BENTYL) 20 MG tablet Take by mouth.   glucose blood (ACCU-CHEK GUIDE) test strip Use 1 test strip to check fasting glucose once daily and as need for diabetes   LATUDA 60 MG TABS TK 1 T PO WITH DINNER   lidocaine (LIDODERM) 5 % Place 1 patch onto the skin daily. Remove & Discard patch within 12 hours or as directed by  MD   NUEDEXTA 20-10 MG CAPS TK ONE C PO BID FOR PSEUDOBULBAR EFFECT   pantoprazole (PROTONIX) 40 MG tablet TAKE 1 TABLET BY MOUTH DAILY   predniSONE (STERAPRED UNI-PAK 21 TAB) 10 MG (21) TBPK tablet Use as directed for 6 days   Vitamin D, Ergocalciferol, (DRISDOL) 1.25 MG (50000 UNIT) CAPS capsule Take 1 capsule (50,000 Units total) by mouth every 7 (seven) days.   [DISCONTINUED] amLODipine (NORVASC) 10 MG tablet TAKE 1 TABLET(10 MG) BY MOUTH DAILY   [DISCONTINUED] celecoxib (CELEBREX) 100 MG capsule Take 1 capsule (100 mg total) by mouth 2 (two) times daily.   [DISCONTINUED] Ipratropium-Albuterol (COMBIVENT) 20-100 MCG/ACT AERS respimat Inhale 1 puff into the lungs every 6 (six) hours.   [DISCONTINUED] levothyroxine (SYNTHROID) 50 MCG tablet Take 1 tab po daily on an empty stomach   [DISCONTINUED] predniSONE (STERAPRED UNI-PAK 21 TAB) 10 MG (21) TBPK tablet Use as directed for 6 days   [DISCONTINUED] Roflumilast (DALIRESP) 250 MCG TABS Take 2 tablets by mouth daily. May decrease back to 1 tablet daily if not able to tolerate increased dose.   [DISCONTINUED] rosuvastatin (CRESTOR) 5 MG tablet TAKE 1 TABLET BY MOUTH DAILY   amLODipine (NORVASC) 10 MG tablet Take 1 tablet (10 mg total) by mouth daily.   Ipratropium-Albuterol (COMBIVENT) 20-100 MCG/ACT AERS respimat Inhale 1 puff into the lungs every 6 (six) hours.   levothyroxine (SYNTHROID) 50 MCG tablet Take 1 tab po daily on an empty stomach   Roflumilast (DALIRESP) 250 MCG TABS  Take 2 tablets by mouth daily. May decrease back to 1 tablet daily if not able to tolerate increased dose.   rosuvastatin (CRESTOR) 5 MG tablet Take 1 tablet (5 mg total) by mouth daily.   No facility-administered encounter medications on file as of 11/04/2023.    Surgical History: Past Surgical History:  Procedure Laterality Date   COSMETIC SURGERY     LAPAROSCOPIC OOPHORECTOMY     few  times   TONSILLECTOMY Bilateral     Medical History: Past Medical  History:  Diagnosis Date   Bipolar 1 disorder (HCC)    Depression    Hypertension    Panic attack     Family History: Family History  Adopted: Yes    Social History   Socioeconomic History   Marital status: Single    Spouse name: Not on file   Number of children: Not on file   Years of education: Not on file   Highest education level: Not on file  Occupational History   Not on file  Tobacco Use   Smoking status: Every Day    Current packs/day: 1.00    Types: Cigarettes   Smokeless tobacco: Never  Substance and Sexual Activity   Alcohol use: Yes    Comment: ocassionally    Drug use: No   Sexual activity: Not on file  Other Topics Concern   Not on file  Social History Narrative   Not on file   Social Drivers of Health   Financial Resource Strain: Not on file  Food Insecurity: Not on file  Transportation Needs: Not on file  Physical Activity: Not on file  Stress: Not on file  Social Connections: Not on file  Intimate Partner Violence: Not on file      Review of Systems  Constitutional:  Positive for fatigue.  HENT:  Positive for congestion, ear pain, postnasal drip, rhinorrhea, sinus pressure, sinus pain and sore throat.   Respiratory:  Positive for cough, chest tightness, shortness of breath and wheezing.   Cardiovascular: Negative.  Negative for chest pain and palpitations.  Gastrointestinal: Negative.   Genitourinary: Negative.   Neurological:  Positive for headaches.    Vital Signs: BP 110/76   Pulse 83   Temp 98.6 F (37 C)   Resp 16   Ht 5\' 3"  (1.6 m)   Wt 127 lb 6.4 oz (57.8 kg)   SpO2 97%   BMI 22.57 kg/m    Physical Exam Vitals reviewed.  Constitutional:      General: She is not in acute distress.    Appearance: Normal appearance. She is normal weight. She is ill-appearing.  HENT:     Head: Normocephalic and atraumatic.     Right Ear: Tympanic membrane, ear canal and external ear normal.     Left Ear: Tympanic membrane, ear canal  and external ear normal.     Nose: Congestion and rhinorrhea present.     Mouth/Throat:     Mouth: Mucous membranes are moist.     Pharynx: Posterior oropharyngeal erythema present.  Eyes:     Pupils: Pupils are equal, round, and reactive to light.  Cardiovascular:     Rate and Rhythm: Normal rate and regular rhythm.     Heart sounds: Normal heart sounds, S1 normal and S2 normal. No murmur heard. Pulmonary:     Effort: Pulmonary effort is normal. No respiratory distress.     Breath sounds: Examination of the right-upper field reveals wheezing. Examination of the left-upper field reveals wheezing.  Examination of the right-middle field reveals decreased breath sounds. Examination of the left-middle field reveals decreased breath sounds. Examination of the right-lower field reveals decreased breath sounds. Examination of the left-lower field reveals decreased breath sounds. Decreased breath sounds and wheezing present.  Neurological:     Mental Status: She is alert.  Psychiatric:        Mood and Affect: Mood normal.        Behavior: Behavior normal. Behavior is cooperative.        Assessment/Plan: 1. Acute non-recurrent maxillary sinusitis (Primary) Take augmentin as prescribed until gone. Prednisone taper prescribed.  - Ipratropium-Albuterol (COMBIVENT) 20-100 MCG/ACT AERS respimat; Inhale 1 puff into the lungs every 6 (six) hours.  Dispense: 4 g; Refill: 3 - predniSONE (STERAPRED UNI-PAK 21 TAB) 10 MG (21) TBPK tablet; Use as directed for 6 days  Dispense: 21 tablet; Refill: 0 - amoxicillin-clavulanate (AUGMENTIN) 875-125 MG tablet; Take 1 tablet by mouth 2 (two) times daily for 10 days. Take with food  Dispense: 20 tablet; Refill: 0  2. Essential hypertension, malignant Stable, continue amlodipine as prescribed.  - amLODipine (NORVASC) 10 MG tablet; Take 1 tablet (10 mg total) by mouth daily.  Dispense: 90 tablet; Refill: 3  3. Centrilobular emphysema (HCC) Continue roflumilast as  prescribed.  - Roflumilast (DALIRESP) 250 MCG TABS; Take 2 tablets by mouth daily. May decrease back to 1 tablet daily if not able to tolerate increased dose.  Dispense: 60 tablet; Refill: 3  4. SOB (shortness of breath) Continue combivent as prescribed. Take prednisone taper as prescribed.  - Ipratropium-Albuterol (COMBIVENT) 20-100 MCG/ACT AERS respimat; Inhale 1 puff into the lungs every 6 (six) hours.  Dispense: 4 g; Refill: 3 - predniSONE (STERAPRED UNI-PAK 21 TAB) 10 MG (21) TBPK tablet; Use as directed for 6 days  Dispense: 21 tablet; Refill: 0  5. Acquired hypothyroidism Continue levothyroxine as prescribed.  - levothyroxine (SYNTHROID) 50 MCG tablet; Take 1 tab po daily on an empty stomach  Dispense: 90 tablet; Refill: 3  6. Mixed hyperlipidemia Continue rosuvastatin as prescribed.  - rosuvastatin (CRESTOR) 5 MG tablet; Take 1 tablet (5 mg total) by mouth daily.  Dispense: 90 tablet; Refill: 1  7. B12 deficiency Repeat lab ordered - B12 and Folate Panel   General Counseling: Melody Moss verbalizes understanding of the findings of todays visit and agrees with plan of treatment. I have discussed any further diagnostic evaluation that may be needed or ordered today. We also reviewed her medications today. she has been encouraged to call the office with any questions or concerns that should arise related to todays visit.    Orders Placed This Encounter  Procedures   B12 and Folate Panel    Meds ordered this encounter  Medications   amLODipine (NORVASC) 10 MG tablet    Sig: Take 1 tablet (10 mg total) by mouth daily.    Dispense:  90 tablet    Refill:  3   Ipratropium-Albuterol (COMBIVENT) 20-100 MCG/ACT AERS respimat    Sig: Inhale 1 puff into the lungs every 6 (six) hours.    Dispense:  4 g    Refill:  3   levothyroxine (SYNTHROID) 50 MCG tablet    Sig: Take 1 tab po daily on an empty stomach    Dispense:  90 tablet    Refill:  3   Roflumilast (DALIRESP) 250 MCG TABS     Sig: Take 2 tablets by mouth daily. May decrease back to 1 tablet daily if not able to tolerate  increased dose.    Dispense:  60 tablet    Refill:  3    Note increased dose, please fill today asap   rosuvastatin (CRESTOR) 5 MG tablet    Sig: Take 1 tablet (5 mg total) by mouth daily.    Dispense:  90 tablet    Refill:  1    Maximum Refills Reached   predniSONE (STERAPRED UNI-PAK 21 TAB) 10 MG (21) TBPK tablet    Sig: Use as directed for 6 days    Dispense:  21 tablet    Refill:  0    Fill new script today.   amoxicillin-clavulanate (AUGMENTIN) 875-125 MG tablet    Sig: Take 1 tablet by mouth 2 (two) times daily for 10 days. Take with food    Dispense:  20 tablet    Refill:  0    Fill new script today    Return for previously scheduled, CPE, Tinya Cadogan PCP in june and otherwise as needed. .   Total time spent:30 Minutes Time spent includes review of chart, medications, test results, and follow up plan with the patient.   [place] Controlled Substance Database was reviewed by me.  This patient was seen by Sallyanne Kuster, FNP-C in collaboration with Dr. Beverely Risen as a part of collaborative care agreement.   Kayode Petion R. Tedd Sias, MSN, FNP-C Internal medicine

## 2023-12-11 ENCOUNTER — Other Ambulatory Visit
Admission: RE | Admit: 2023-12-11 | Discharge: 2023-12-11 | Disposition: A | Discharge status: Home / Self Care | Payer: MEDICAID | Attending: Nurse Practitioner | Admitting: Nurse Practitioner

## 2023-12-11 DIAGNOSIS — E538 Deficiency of other specified B group vitamins: Secondary | ICD-10-CM | POA: Diagnosis present

## 2023-12-11 LAB — FOLATE: Folate: 12.8 ng/mL (ref >5.9)

## 2023-12-11 LAB — VITAMIN B12: Vitamin B-12: 361 pg/mL (ref 180–914)

## 2023-12-15 ENCOUNTER — Other Ambulatory Visit: Payer: Self-pay | Admitting: Physical Medicine & Rehabilitation

## 2023-12-15 DIAGNOSIS — M5412 Radiculopathy, cervical region: Secondary | ICD-10-CM

## 2023-12-17 ENCOUNTER — Encounter: Payer: Self-pay | Admitting: Physical Medicine & Rehabilitation

## 2023-12-23 NOTE — Discharge Instructions (Signed)

## 2023-12-24 ENCOUNTER — Inpatient Hospital Stay
Admission: RE | Admit: 2023-12-24 | Discharge: 2023-12-24 | Disposition: A | Discharge status: Home / Self Care | Payer: MEDICAID | Source: Ambulatory Visit | Attending: Physical Medicine & Rehabilitation | Admitting: Physical Medicine & Rehabilitation

## 2023-12-26 ENCOUNTER — Ambulatory Visit: Admission: RE | Admit: 2023-12-26 | Payer: MEDICAID | Source: Ambulatory Visit

## 2023-12-30 ENCOUNTER — Telehealth: Payer: Self-pay

## 2024-01-02 ENCOUNTER — Other Ambulatory Visit: Payer: Self-pay | Admitting: Nurse Practitioner

## 2024-01-02 DIAGNOSIS — R911 Solitary pulmonary nodule: Secondary | ICD-10-CM

## 2024-01-02 NOTE — Addendum Note (Signed)
Addended by: Sallyanne Kuster on: 01/02/2024 12:38 PM   Modules accepted: Orders

## 2024-01-02 NOTE — Telephone Encounter (Signed)
Left message for patient to give office a call.

## 2024-01-06 ENCOUNTER — Telehealth: Payer: Self-pay

## 2024-01-06 NOTE — Telephone Encounter (Signed)
-----   Message from [facility] sent at 01/06/2024  9:00 AM EST ----- We can restart B12 injections if they were helping in the past. Her B12 level is still borderline low

## 2024-01-06 NOTE — Telephone Encounter (Signed)
 Patient notified

## 2024-01-06 NOTE — Progress Notes (Signed)
 We can restart B12 injections if they were helping in the past. Her B12 level is still borderline low

## 2024-01-06 NOTE — Telephone Encounter (Signed)
 Patient was notified. She will give the office a call when she is feeling better to get on the schedule.

## 2024-01-07 ENCOUNTER — Telehealth: Payer: Self-pay | Admitting: Nurse Practitioner

## 2024-01-07 NOTE — Telephone Encounter (Signed)
 Lvm letting patient know authorization for chest CT has been extended to 07/22/24 so she can call central scheduling @ [phone] opt #2 to r/s her chest CT-Toni

## 2024-01-14 ENCOUNTER — Ambulatory Visit (INDEPENDENT_AMBULATORY_CARE_PROVIDER_SITE_OTHER): Payer: MEDICAID

## 2024-01-14 DIAGNOSIS — E538 Deficiency of other specified B group vitamins: Secondary | ICD-10-CM

## 2024-01-14 MED ORDER — CYANOCOBALAMIN 1000 MCG/ML IJ SOLN
1000.0000 ug | Freq: Once | INTRAMUSCULAR | Status: AC
Start: 2024-01-14 — End: 2024-01-14
  Administered 2024-01-14: 1000 ug via INTRAMUSCULAR

## 2024-01-27 NOTE — Discharge Instructions (Signed)

## 2024-01-29 ENCOUNTER — Ambulatory Visit
Admission: RE | Admit: 2024-01-29 | Discharge: 2024-01-29 | Disposition: A | Discharge status: Home / Self Care | Payer: MEDICAID | Source: Ambulatory Visit | Attending: Physical Medicine & Rehabilitation | Admitting: Physical Medicine & Rehabilitation

## 2024-01-29 DIAGNOSIS — M5412 Radiculopathy, cervical region: Secondary | ICD-10-CM

## 2024-01-29 MED ORDER — IOPAMIDOL (ISOVUE-M 300) INJECTION 61%
1.0000 mL | Freq: Once | INTRAMUSCULAR | Status: AC | PRN
  Administered 2024-01-29: 1 mL via EPIDURAL

## 2024-01-29 MED ORDER — TRIAMCINOLONE ACETONIDE 40 MG/ML IJ SUSP (RADIOLOGY)
60.0000 mg | Freq: Once | INTRAMUSCULAR | Status: DC
  Administered 2024-01-29: 60 mg via EPIDURAL

## 2024-02-11 ENCOUNTER — Ambulatory Visit (INDEPENDENT_AMBULATORY_CARE_PROVIDER_SITE_OTHER): Payer: MEDICAID

## 2024-02-11 DIAGNOSIS — E538 Deficiency of other specified B group vitamins: Secondary | ICD-10-CM

## 2024-02-11 MED ORDER — CYANOCOBALAMIN 1000 MCG/ML IJ SOLN
1000.0000 ug | Freq: Once | INTRAMUSCULAR | Status: AC
Start: 2024-02-11 — End: 2024-02-11
  Administered 2024-02-11: 1000 ug via INTRAMUSCULAR

## 2024-03-03 ENCOUNTER — Other Ambulatory Visit: Payer: Self-pay | Admitting: Nurse Practitioner

## 2024-03-03 DIAGNOSIS — R0602 Shortness of breath: Secondary | ICD-10-CM

## 2024-03-03 DIAGNOSIS — J0100 Acute maxillary sinusitis, unspecified: Secondary | ICD-10-CM

## 2024-03-10 ENCOUNTER — Ambulatory Visit: Payer: MEDICAID

## 2024-03-10 ENCOUNTER — Other Ambulatory Visit: Payer: Self-pay | Admitting: Nurse Practitioner

## 2024-03-10 DIAGNOSIS — J432 Centrilobular emphysema: Secondary | ICD-10-CM

## 2024-03-17 ENCOUNTER — Ambulatory Visit (INDEPENDENT_AMBULATORY_CARE_PROVIDER_SITE_OTHER): Payer: MEDICAID

## 2024-03-17 DIAGNOSIS — E538 Deficiency of other specified B group vitamins: Secondary | ICD-10-CM | POA: Diagnosis not present

## 2024-03-17 MED ORDER — CYANOCOBALAMIN 1000 MCG/ML IJ SOLN
1000.0000 ug | Freq: Once | INTRAMUSCULAR | Status: AC
Start: 2024-03-17 — End: 2024-03-17
  Administered 2024-03-17: 1000 ug via INTRAMUSCULAR

## 2024-03-26 ENCOUNTER — Other Ambulatory Visit: Payer: Self-pay | Admitting: Nurse Practitioner

## 2024-04-06 ENCOUNTER — Other Ambulatory Visit: Payer: Self-pay | Admitting: Physical Medicine & Rehabilitation

## 2024-04-06 DIAGNOSIS — M5412 Radiculopathy, cervical region: Secondary | ICD-10-CM

## 2024-04-14 ENCOUNTER — Ambulatory Visit (INDEPENDENT_AMBULATORY_CARE_PROVIDER_SITE_OTHER): Payer: MEDICAID

## 2024-04-14 DIAGNOSIS — E538 Deficiency of other specified B group vitamins: Secondary | ICD-10-CM | POA: Diagnosis not present

## 2024-04-14 MED ORDER — CYANOCOBALAMIN 1000 MCG/ML IJ SOLN
1000.0000 ug | Freq: Once | INTRAMUSCULAR | Status: AC
Start: 2024-04-14 — End: 2024-04-14
  Administered 2024-04-14: 1000 ug via INTRAMUSCULAR

## 2024-05-06 ENCOUNTER — Encounter: Payer: MEDICAID | Admitting: Nurse Practitioner

## 2024-05-19 ENCOUNTER — Ambulatory Visit: Payer: MEDICAID

## 2024-05-20 ENCOUNTER — Encounter: Payer: Self-pay | Admitting: Nurse Practitioner

## 2024-05-20 ENCOUNTER — Ambulatory Visit: Payer: MEDICAID | Admitting: Nurse Practitioner

## 2024-05-20 VITALS — BP 120/74 | HR 89 | Temp 97.1°F | Resp 16 | Ht 63.0 in | Wt 127.4 lb

## 2024-05-20 DIAGNOSIS — Z0001 Encounter for general adult medical examination with abnormal findings: Secondary | ICD-10-CM

## 2024-05-20 DIAGNOSIS — E538 Deficiency of other specified B group vitamins: Secondary | ICD-10-CM

## 2024-05-20 DIAGNOSIS — E782 Mixed hyperlipidemia: Secondary | ICD-10-CM

## 2024-05-20 DIAGNOSIS — J432 Centrilobular emphysema: Secondary | ICD-10-CM | POA: Diagnosis not present

## 2024-05-20 DIAGNOSIS — Z1231 Encounter for screening mammogram for malignant neoplasm of breast: Secondary | ICD-10-CM

## 2024-05-20 DIAGNOSIS — H60393 Other infective otitis externa, bilateral: Secondary | ICD-10-CM

## 2024-05-20 DIAGNOSIS — E559 Vitamin D deficiency, unspecified: Secondary | ICD-10-CM

## 2024-05-20 MED ORDER — ROFLUMILAST 500 MCG PO TABS: 500.0000 ug | ORAL_TABLET | Freq: Every day | ORAL | 1 refills | Status: DC

## 2024-05-20 MED ORDER — PANTOPRAZOLE SODIUM 40 MG PO TBEC: 40.0000 mg | DELAYED_RELEASE_TABLET | Freq: Every day | ORAL | 3 refills | Status: AC

## 2024-05-20 MED ORDER — CLINDAMYCIN PHOSPHATE 1 % EX SOLN: 1.0000 | Freq: Two times a day (BID) | CUTANEOUS | 2 refills | Status: AC

## 2024-05-20 MED ORDER — VITAMIN D (ERGOCALCIFEROL) 1.25 MG (50000 UNIT) PO CAPS
50000.0000 [IU] | ORAL_CAPSULE | ORAL | 0 refills | Status: AC
Start: 2024-05-20 — End: ?

## 2024-05-20 MED ORDER — BREZTRI AEROSPHERE 160-9-4.8 MCG/ACT IN AERO: 2.0000 | INHALATION_SPRAY | Freq: Two times a day (BID) | RESPIRATORY_TRACT | 11 refills | Status: AC

## 2024-05-20 MED ORDER — COMBIVENT RESPIMAT 20-100 MCG/ACT IN AERS
1.0000 | INHALATION_SPRAY | Freq: Four times a day (QID) | RESPIRATORY_TRACT | 3 refills | Status: DC | PRN
Start: 2024-05-20 — End: 2024-07-23

## 2024-05-20 MED ORDER — CARVEDILOL 6.25 MG PO TABS: ORAL_TABLET | ORAL | 1 refills | Status: DC

## 2024-05-20 MED ORDER — ROSUVASTATIN CALCIUM 5 MG PO TABS
5.0000 mg | ORAL_TABLET | Freq: Every day | ORAL | 1 refills | Status: DC
Start: 2024-05-20 — End: 2024-07-29

## 2024-05-20 MED ORDER — DICLOFENAC SODIUM 1 % EX GEL: 4.0000 g | Freq: Four times a day (QID) | CUTANEOUS | 2 refills | Status: AC

## 2024-05-20 MED ORDER — OFLOXACIN 0.3 % OT SOLN: 5.0000 [drp] | Freq: Every day | OTIC | 0 refills | Status: AC

## 2024-05-20 NOTE — Progress Notes (Signed)
 [facility] [address] [place], [place] [address]  Internal MEDICINE  Office Visit Note  Patient Name: Melody Moss  [id number]  [id number]  Date of Service: 05/20/2024  Chief Complaint  Patient presents with   Depression   Hypertension   Annual Exam    HPI Melody Moss presents for an annual well visit and physical exam.  Well-appearing 63 y.o. female with COPD, asthma, migraines, hypertension, DDD, arthritis, high cholesterol, GAD, B12 deficiency, and vitamin D  deficiency.  Routine CRC screening: due in 2026 for cologuard Routine mammogram: due in September this year  DEXA scan: due in 5 years  Pap smear: due in December this year  Labs: due for routine labs  New or worsening pain: chronic pain Will reschedule lung cancer screening, overdue since February.    Current Medication: Outpatient Encounter Medications as of 05/20/2024  Medication Sig   ofloxacin (FLOXIN) 0.3 % OTIC solution Place 5 drops into both ears daily for 5 days.   roflumilast  (DALIRESP ) 500 MCG TABS tablet Take 1 tablet (500 mcg total) by mouth daily.   Accu-Chek Softclix Lancets lancets Use 1 lancet to check fasting glucose once daily and as needed for diabetes   amLODipine  (NORVASC ) 10 MG tablet Take 1 tablet (10 mg total) by mouth daily.   budesonide -glycopyrrolate-formoterol  (BREZTRI  AEROSPHERE) 160-9-4.8 MCG/ACT AERO inhaler Inhale 2 puffs into the lungs 2 (two) times daily.   buPROPion (WELLBUTRIN XL) 150 MG 24 hr tablet TK 1 T PO QAM   carbamazepine (CARBATROL) 300 MG 12 hr capsule TK 2 [place] PO QHS   carvedilol  (COREG ) 6.25 MG tablet TAKE 1 TABLET(6.25 MG) BY MOUTH TWICE DAILY WITH A MEAL   clindamycin  (CLEOCIN  T) 1 % external solution Apply 1 Application topically 2 (two) times daily.   diclofenac  Sodium (VOLTAREN ) 1 % GEL Apply 4 g topically 4 (four) times daily.   dicyclomine (BENTYL) 20 MG tablet Take by mouth.   glucose blood (ACCU-CHEK GUIDE) test strip Use 1 test strip to check fasting  glucose once daily and as need for diabetes   Ipratropium-Albuterol  (COMBIVENT RESPIMAT) 20-100 MCG/ACT AERS respimat Inhale 1 puff into the lungs every 6 (six) hours as needed for wheezing or shortness of breath.   LATUDA 60 MG TABS TK 1 T PO WITH DINNER   levothyroxine  (SYNTHROID ) 50 MCG tablet Take 1 tab po daily on an empty stomach   lidocaine  (LIDODERM ) 5 % Place 1 patch onto the skin daily. Remove & Discard patch within 12 hours or as directed by MD   NUEDEXTA 20-10 MG CAPS TK ONE C PO BID FOR PSEUDOBULBAR EFFECT   pantoprazole  (PROTONIX ) 40 MG tablet Take 1 tablet (40 mg total) by mouth daily.   rosuvastatin  (CRESTOR ) 5 MG tablet Take 1 tablet (5 mg total) by mouth daily.   Vitamin D , Ergocalciferol , (DRISDOL ) 1.25 MG (50000 UNIT) CAPS capsule Take 1 capsule (50,000 Units total) by mouth every 7 (seven) days.   [DISCONTINUED] Budeson-Glycopyrrol-Formoterol  (BREZTRI  AEROSPHERE) 160-9-4.8 MCG/ACT AERO Inhale 2 puffs into the lungs 2 (two) times daily.   [DISCONTINUED] carvedilol  (COREG ) 6.25 MG tablet TAKE 1 TABLET(6.25 MG) BY MOUTH TWICE DAILY WITH A MEAL   [DISCONTINUED] clindamycin  (CLEOCIN  T) 1 % external solution Apply 1 Application topically 2 (two) times daily.   [DISCONTINUED] COMBIVENT RESPIMAT 20-100 MCG/ACT AERS respimat INHALE 1 PUFF INTO THE LUNGS EVERY 6 (SIX) HOURS.   [DISCONTINUED] diclofenac  Sodium (VOLTAREN ) 1 % GEL Apply 4 g topically 4 (four) times daily.   [DISCONTINUED] pantoprazole  (PROTONIX )  40 MG tablet TAKE 1 TABLET BY MOUTH DAILY   [DISCONTINUED] predniSONE  (STERAPRED UNI-PAK 21 TAB) 10 MG (21) TBPK tablet Use as directed for 6 days   [DISCONTINUED] Roflumilast  250 MCG TABS TAKE 2 TABLETS BY MOUTH DAILY MAY DECREASE BACK TAKE 1 TABLET IF NOT ABLE TO TOLERATE INCREASED DOSE   [DISCONTINUED] rosuvastatin  (CRESTOR ) 5 MG tablet Take 1 tablet (5 mg total) by mouth daily.   [DISCONTINUED] Vitamin D , Ergocalciferol , (DRISDOL ) 1.25 MG (50000 UNIT) CAPS capsule TAKE 1 CAPSULE  (50,000 UNITS TOTAL) BY MOUTH EVERY 7 (SEVEN) DAYS.   No facility-administered encounter medications on file as of 05/20/2024.    Surgical History: Past Surgical History:  Procedure Laterality Date   COSMETIC SURGERY     LAPAROSCOPIC OOPHORECTOMY     few  times   TONSILLECTOMY Bilateral     Medical History: Past Medical History:  Diagnosis Date   Bipolar 1 disorder (HCC)    Depression    Hypertension    Panic attack     Family History: Family History  Adopted: Yes    Social History   Socioeconomic History   Marital status: Single    Spouse name: Not on file   Number of children: Not on file   Years of education: Not on file   Highest education level: Not on file  Occupational History   Not on file  Tobacco Use   Smoking status: Every Day    Current packs/day: 1.00    Types: Cigarettes   Smokeless tobacco: Never  Substance and Sexual Activity   Alcohol use: Yes    Comment: ocassionally    Drug use: No   Sexual activity: Not on file  Other Topics Concern   Not on file  Social History Narrative   Not on file   Social Drivers of Health   Financial Resource Strain: Not on file  Food Insecurity: Not on file  Transportation Needs: Not on file  Physical Activity: Not on file  Stress: Not on file  Social Connections: Not on file  Intimate Partner Violence: Not on file      Review of Systems  Constitutional:  Positive for fatigue. Negative for activity change, appetite change, chills, fever and unexpected weight change.  HENT:  Positive for ear pain, hearing loss, postnasal drip, sinus pressure and tinnitus. Negative for congestion, rhinorrhea, sore throat and trouble swallowing.   Eyes: Negative.   Respiratory:  Positive for shortness of breath (intermittent). Negative for cough, chest tightness and wheezing.   Cardiovascular: Negative.  Negative for chest pain and palpitations.  Gastrointestinal: Negative.  Negative for abdominal pain, blood in stool,  constipation, diarrhea, nausea and vomiting.  Endocrine: Negative.   Genitourinary: Negative.  Negative for difficulty urinating, dysuria, frequency, hematuria and urgency.  Musculoskeletal:  Positive for arthralgias and back pain. Negative for joint swelling, myalgias and neck pain.  Skin: Negative.  Negative for rash and wound.  Allergic/Immunologic: Negative.  Negative for immunocompromised state.  Neurological: Negative.  Negative for dizziness, seizures, numbness and headaches.  Hematological: Negative.   Psychiatric/Behavioral:  Negative for behavioral problems, self-injury, sleep disturbance and suicidal ideas. The patient is nervous/anxious.     Vital Signs: BP 120/74   Pulse 89   Temp (!) 97.1 F (36.2 C)   Resp 16   Ht 5' 3 (1.6 m)   Wt 127 lb 6.4 oz (57.8 kg)   SpO2 96%   BMI 22.57 kg/m    Physical Exam Vitals reviewed.  Constitutional:  General: She is not in acute distress.    Appearance: She is well-developed. She is not diaphoretic.  HENT:     Head: Normocephalic and atraumatic.     Right Ear: External ear normal. Decreased hearing noted. Swelling and tenderness present. Tympanic membrane is erythematous.     Left Ear: External ear normal. Decreased hearing noted. Swelling and tenderness present. Tympanic membrane is erythematous.     Nose: Nose normal. No congestion or rhinorrhea.     Mouth/Throat:     Mouth: Mucous membranes are moist.     Pharynx: Oropharynx is clear. No oropharyngeal exudate or posterior oropharyngeal erythema.  Eyes:     General: No scleral icterus.       Right eye: No discharge.        Left eye: No discharge.     Extraocular Movements: Extraocular movements intact.     Conjunctiva/sclera: Conjunctivae normal.     Pupils: Pupils are equal, round, and reactive to light.  Neck:     Thyroid : No thyromegaly.     Vascular: No JVD.     Trachea: No tracheal deviation.  Cardiovascular:     Rate and Rhythm: Normal rate and regular  rhythm.     Heart sounds: Normal heart sounds. No murmur heard.    No friction rub. No gallop.  Pulmonary:     Effort: Pulmonary effort is normal. No respiratory distress.     Breath sounds: Normal breath sounds. No stridor. No wheezing or rales.  Chest:     Chest wall: No tenderness.  Abdominal:     General: Bowel sounds are normal. There is no distension.     Palpations: Abdomen is soft. There is no mass.     Tenderness: There is no abdominal tenderness. There is no guarding or rebound.  Musculoskeletal:        General: No tenderness or deformity. Normal range of motion.     Cervical back: Normal range of motion and neck supple.  Lymphadenopathy:     Cervical: No cervical adenopathy.  Skin:    General: Skin is warm and dry.     Coloration: Skin is not pale.     Findings: No erythema or rash.  Neurological:     Mental Status: She is alert.     Cranial Nerves: No cranial nerve deficit.     Motor: No abnormal muscle tone.     Coordination: Coordination normal.     Deep Tendon Reflexes: Reflexes are normal and symmetric.  Psychiatric:        Behavior: Behavior normal.        Thought Content: Thought content normal.        Judgment: Judgment normal.        Assessment/Plan: 1. Encounter for routine adult health examination with abnormal findings (Primary) Age-appropriate preventive screenings and vaccinations discussed, annual physical exam completed. Routine labs for health maintenance ordered, see below. PHM updated.  Routine medication refills done  - CBC with Differential/Platelet - CMP14+EGFR - Lipid Profile - B12 and Folate Panel - TSH + free T4 - Vitamin D  (25 hydroxy) - carvedilol  (COREG ) 6.25 MG tablet; TAKE 1 TABLET(6.25 MG) BY MOUTH TWICE DAILY WITH A MEAL  Dispense: 180 tablet; Refill: 1 - clindamycin  (CLEOCIN  T) 1 % external solution; Apply 1 Application topically 2 (two) times daily.  Dispense: 60 mL; Refill: 2 - diclofenac  Sodium (VOLTAREN ) 1 % GEL; Apply 4  g topically 4 (four) times daily.  Dispense: 100 g; Refill: 2 - pantoprazole  (  PROTONIX ) 40 MG tablet; Take 1 tablet (40 mg total) by mouth daily.  Dispense: 90 tablet; Refill: 3  2. Centrilobular emphysema (HCC) Continue breztri  for maintenance inhaler. Continue combivent as needed. Roflumilast  changed to a 500 mcg tablet, and she is to take one tablet daily.  - budesonide -glycopyrrolate-formoterol  (BREZTRI  AEROSPHERE) 160-9-4.8 MCG/ACT AERO inhaler; Inhale 2 puffs into the lungs 2 (two) times daily.  Dispense: 10.7 g; Refill: 11 - Ipratropium-Albuterol  (COMBIVENT RESPIMAT) 20-100 MCG/ACT AERS respimat; Inhale 1 puff into the lungs every 6 (six) hours as needed for wheezing or shortness of breath.  Dispense: 4 g; Refill: 3 - roflumilast  (DALIRESP ) 500 MCG TABS tablet; Take 1 tablet (500 mcg total) by mouth daily.  Dispense: 90 tablet; Refill: 1  3. Other infective acute otitis externa of both ears Ofloxacin ear drops ordered, take as prescribed.  - ofloxacin (FLOXIN) 0.3 % OTIC solution; Place 5 drops into both ears daily for 5 days.  Dispense: 5 mL; Refill: 0  4. Mixed hyperlipidemia Continue rosuvastatin  as prescribed. Routine labs ordered  - CBC with Differential/Platelet - CMP14+EGFR - Lipid Profile - rosuvastatin  (CRESTOR ) 5 MG tablet; Take 1 tablet (5 mg total) by mouth daily.  Dispense: 90 tablet; Refill: 1  5. B12 deficiency Routine labs ordered  - CBC with Differential/Platelet - CMP14+EGFR - Lipid Profile - B12 and Folate Panel - TSH + free T4 - Vitamin D  (25 hydroxy)  6. Vitamin D  deficiency Routine lab ordered, continue weekly vitamin D  supplement. - Vitamin D  (25 hydroxy) - Vitamin D , Ergocalciferol , (DRISDOL ) 1.25 MG (50000 UNIT) CAPS capsule; Take 1 capsule (50,000 Units total) by mouth every 7 (seven) days.  Dispense: 12 capsule; Refill: 0  7. Encounter for screening mammogram for malignant neoplasm of breast Routine mammogram ordered.  - MM 3D SCREENING MAMMOGRAM  BILATERAL BREAST; Future   General Counseling: Asante verbalizes understanding of the findings of todays visit and agrees with plan of treatment. I have discussed any further diagnostic evaluation that may be needed or ordered today. We also reviewed her medications today. she has been encouraged to call the office with any questions or concerns that should arise related to todays visit.    Orders Placed This Encounter  Procedures   MM 3D SCREENING MAMMOGRAM BILATERAL BREAST   CBC with Differential/Platelet   CMP14+EGFR   Lipid Profile   B12 and Folate Panel   TSH + free T4   Vitamin D  (25 hydroxy)    Meds ordered this encounter  Medications   budesonide -glycopyrrolate-formoterol  (BREZTRI  AEROSPHERE) 160-9-4.8 MCG/ACT AERO inhaler    Sig: Inhale 2 puffs into the lungs 2 (two) times daily.    Dispense:  10.7 g    Refill:  11    Dx code J44.9; has tried and failed symbicort  and dulera .   carvedilol  (COREG ) 6.25 MG tablet    Sig: TAKE 1 TABLET(6.25 MG) BY MOUTH TWICE DAILY WITH A MEAL    Dispense:  180 tablet    Refill:  1   clindamycin  (CLEOCIN  T) 1 % external solution    Sig: Apply 1 Application topically 2 (two) times daily.    Dispense:  60 mL    Refill:  2   Ipratropium-Albuterol  (COMBIVENT RESPIMAT) 20-100 MCG/ACT AERS respimat    Sig: Inhale 1 puff into the lungs every 6 (six) hours as needed for wheezing or shortness of breath.    Dispense:  4 g    Refill:  3    refill request... thanks and have a great  day!   diclofenac  Sodium (VOLTAREN ) 1 % GEL    Sig: Apply 4 g topically 4 (four) times daily.    Dispense:  100 g    Refill:  2   pantoprazole  (PROTONIX ) 40 MG tablet    Sig: Take 1 tablet (40 mg total) by mouth daily.    Dispense:  90 tablet    Refill:  3    patient request refill., thanks   roflumilast  (DALIRESP ) 500 MCG TABS tablet    Sig: Take 1 tablet (500 mcg total) by mouth daily.    Dispense:  90 tablet    Refill:  1    Note change in dose strength to 500  mcg tab, fill new script and discontinue 250 mcg tab   rosuvastatin  (CRESTOR ) 5 MG tablet    Sig: Take 1 tablet (5 mg total) by mouth daily.    Dispense:  90 tablet    Refill:  1    Maximum Refills Reached   Vitamin D , Ergocalciferol , (DRISDOL ) 1.25 MG (50000 UNIT) CAPS capsule    Sig: Take 1 capsule (50,000 Units total) by mouth every 7 (seven) days.    Dispense:  12 capsule    Refill:  0    refill request...thanks and have a great day!   ofloxacin (FLOXIN) 0.3 % OTIC solution    Sig: Place 5 drops into both ears daily for 5 days.    Dispense:  5 mL    Refill:  0    Fill new script today    Return in about 6 weeks (around 06/29/2024) for F/U, Labs, Dimitry Holsworth PCP.   Total time spent:30 Minutes Time spent includes review of chart, medications, test results, and follow up plan with the patient.    Controlled Substance Database was reviewed by me.  This patient was seen by Mardy Maxin, FNP-C in collaboration with Dr. Sigrid Bathe as a part of collaborative care agreement.  Vahan Wadsworth R. Maxin, MSN, FNP-C Internal medicine

## 2024-05-26 NOTE — Discharge Instructions (Signed)

## 2024-05-27 ENCOUNTER — Ambulatory Visit
Admission: RE | Admit: 2024-05-27 | Discharge: 2024-05-27 | Disposition: A | Discharge status: Home / Self Care | Payer: MEDICAID | Source: Ambulatory Visit | Attending: Physical Medicine & Rehabilitation | Admitting: Physical Medicine & Rehabilitation

## 2024-05-27 DIAGNOSIS — M5412 Radiculopathy, cervical region: Secondary | ICD-10-CM

## 2024-05-27 MED ORDER — TRIAMCINOLONE ACETONIDE 40 MG/ML IJ SUSP (RADIOLOGY)
60.0000 mg | Freq: Once | INTRAMUSCULAR | Status: AC
  Administered 2024-05-27: 60 mg via EPIDURAL

## 2024-05-27 MED ORDER — IOPAMIDOL (ISOVUE-300) INJECTION 61%
10.0000 mL | Freq: Once | INTRAVENOUS | Status: AC | PRN
  Administered 2024-05-27: 3 mL

## 2024-06-19 LAB — GLUCOSE, POCT (MANUAL RESULT ENTRY): POC Glucose: 104 mg/dL — AB (ref 70–99)

## 2024-06-19 NOTE — Congregational Nurse Program (Signed)
  Dept: [phone]   Congregational Nurse Program Note  Date of Encounter: 06/19/2024  Past Medical History: Past Medical History:  Diagnosis Date   Bipolar 1 disorder (HCC)    Depression    Hypertension    Panic attack     Encounter Details:  Community Questionnaire - 06/19/24 1306       Questionnaire   Ask client: Do you give verbal consent for me to treat you today? Yes    Student Assistance N/A    Location Patient Served  [facility]    Encounter Setting CN site    Population Status Unknown    Insurance Medicaid    Insurance/Financial Assistance Referral N/A    Medication N/A    Medical Provider Yes    Screening Referrals Made N/A    Medical Referrals Made N/A    Medical Appointment Completed N/A    CNP Interventions Advocate/Support    Screenings CN Performed Blood Pressure;Blood Glucose    ED Visit Averted N/A    Life-Saving Intervention Made N/A         Today's Vitals   06/19/24 1305  BP: 139/82   There is no height or weight on file to calculate BMI.

## 2024-06-28 ENCOUNTER — Other Ambulatory Visit
Admission: RE | Admit: 2024-06-28 | Discharge: 2024-06-28 | Disposition: A | Discharge status: Home / Self Care | Payer: MEDICAID | Attending: Nurse Practitioner | Admitting: Nurse Practitioner

## 2024-06-28 DIAGNOSIS — E782 Mixed hyperlipidemia: Secondary | ICD-10-CM | POA: Diagnosis not present

## 2024-06-28 DIAGNOSIS — E559 Vitamin D deficiency, unspecified: Secondary | ICD-10-CM | POA: Insufficient documentation

## 2024-06-28 DIAGNOSIS — F411 Generalized anxiety disorder: Secondary | ICD-10-CM | POA: Insufficient documentation

## 2024-06-28 DIAGNOSIS — Z0001 Encounter for general adult medical examination with abnormal findings: Secondary | ICD-10-CM | POA: Insufficient documentation

## 2024-06-28 DIAGNOSIS — E538 Deficiency of other specified B group vitamins: Secondary | ICD-10-CM | POA: Diagnosis not present

## 2024-06-28 DIAGNOSIS — R5383 Other fatigue: Secondary | ICD-10-CM | POA: Insufficient documentation

## 2024-06-28 LAB — LIPID PANEL
Cholesterol: 156 mg/dL (ref 0–200)
HDL: 78 mg/dL (ref >40)
LDL Cholesterol: 70 mg/dL (ref 0–99)
Total CHOL/HDL Ratio: 2 ratio
Triglycerides: 41 mg/dL (ref <150)
VLDL: 8 mg/dL (ref 0–40)

## 2024-06-28 LAB — CBC WITH DIFFERENTIAL/PLATELET
Abs Immature Granulocytes: 0.01 K/uL (ref 0.00–0.07)
Basophils Absolute: 0.1 K/uL (ref 0.0–0.1)
Basophils Relative: 1 %
Eosinophils Absolute: 0.2 K/uL (ref 0.0–0.5)
Eosinophils Relative: 4 %
HCT: 43.1 % (ref 36.0–46.0)
Hemoglobin: 14.5 g/dL (ref 12.0–15.0)
Immature Granulocytes: 0 %
Lymphocytes Relative: 33 %
Lymphs Abs: 1.7 K/uL (ref 0.7–4.0)
MCH: 31.1 pg (ref 26.0–34.0)
MCHC: 33.6 g/dL (ref 30.0–36.0)
MCV: 92.5 fL (ref 80.0–100.0)
Monocytes Absolute: 0.4 K/uL (ref 0.1–1.0)
Monocytes Relative: 8 %
Neutro Abs: 2.8 K/uL (ref 1.7–7.7)
Neutrophils Relative %: 54 %
Platelets: 257 K/uL (ref 150–400)
RBC: 4.66 MIL/uL (ref 3.87–5.11)
RDW: 12.6 % (ref 11.5–15.5)
WBC: 5.2 K/uL (ref 4.0–10.5)
nRBC: 0 % (ref 0.0–0.2)

## 2024-06-28 LAB — VITAMIN D 25 HYDROXY (VIT D DEFICIENCY, FRACTURES): Vit D, 25-Hydroxy: 101.2 ng/mL — ABNORMAL HIGH (ref 30–100)

## 2024-06-28 LAB — COMPREHENSIVE METABOLIC PANEL WITH GFR
ALT: 22 U/L (ref 0–44)
AST: 19 U/L (ref 15–41)
Albumin: 4.1 g/dL (ref 3.5–5.0)
Alkaline Phosphatase: 60 U/L (ref 38–126)
Anion gap: 8 (ref 5–15)
BUN: 16 mg/dL (ref 6–20)
CO2: 24 mmol/L (ref 22–32)
Calcium: 9.2 mg/dL (ref 8.9–10.3)
Chloride: 105 mmol/L (ref 98–111)
Creatinine, Ser: 0.5 mg/dL (ref 0.44–1.00)
GFR, Estimated: >60 mL/min (ref >60)
Glucose, Bld: 112 mg/dL — ABNORMAL HIGH (ref 70–99)
Potassium: 3.9 mmol/L (ref 3.5–5.1)
Sodium: 137 mmol/L (ref 135–145)
Total Bilirubin: 0.6 mg/dL (ref 0.0–1.2)
Total Protein: 7.4 g/dL (ref 6.5–8.1)

## 2024-06-28 LAB — TSH: TSH: 1.492 u[IU]/mL (ref 0.350–4.500)

## 2024-06-28 LAB — VITAMIN B12: Vitamin B-12: 333 pg/mL (ref 180–914)

## 2024-06-28 LAB — FOLATE: Folate: 13.5 ng/mL (ref >5.9)

## 2024-06-28 LAB — T4, FREE: Free T4: 0.8 ng/dL (ref 0.61–1.12)

## 2024-06-29 ENCOUNTER — Ambulatory Visit: Payer: MEDICAID | Admitting: Nurse Practitioner

## 2024-07-21 ENCOUNTER — Encounter: Payer: Self-pay | Admitting: Nurse Practitioner

## 2024-07-21 ENCOUNTER — Ambulatory Visit: Payer: MEDICAID | Admitting: Nurse Practitioner

## 2024-07-21 VITALS — BP 130/70 | HR 95 | Temp 98.0°F | Resp 16 | Ht 63.0 in | Wt 129.6 lb

## 2024-07-21 DIAGNOSIS — R7301 Impaired fasting glucose: Secondary | ICD-10-CM

## 2024-07-21 DIAGNOSIS — M5412 Radiculopathy, cervical region: Secondary | ICD-10-CM | POA: Diagnosis not present

## 2024-07-21 DIAGNOSIS — M546 Pain in thoracic spine: Secondary | ICD-10-CM | POA: Diagnosis not present

## 2024-07-21 DIAGNOSIS — R7303 Prediabetes: Secondary | ICD-10-CM

## 2024-07-21 DIAGNOSIS — G8929 Other chronic pain: Secondary | ICD-10-CM

## 2024-07-21 DIAGNOSIS — R42 Dizziness and giddiness: Secondary | ICD-10-CM

## 2024-07-21 LAB — POCT GLYCOSYLATED HEMOGLOBIN (HGB A1C): Hemoglobin A1C: 5.8 % — AB (ref 4.0–5.6)

## 2024-07-21 MED ORDER — PREDNISONE 10 MG PO TABS: ORAL_TABLET | ORAL | 0 refills | Status: AC

## 2024-07-21 NOTE — Progress Notes (Signed)
 [facility] [address] [place], [place] [address]  Internal MEDICINE  Office Visit Note  Patient Name: Melody Moss  [id number]  [id number]  Date of Service: 07/21/2024  Chief Complaint  Patient presents with   Follow-up    Labs    Anxiety   Depression   Back Pain   Neck Pain    HPI Mckensie presents for a follow-up visit for lab results, dizziness and vertigo Vitamin D  was low and is now too high Elevated fasting glucose Hypertension -- blood pressure inititally elevated, rechecked and improved Dizziness and vertigo Prediabetes -- A1c is 5.8 today   Current Medication: Outpatient Encounter Medications as of 07/21/2024  Medication Sig   Accu-Chek Softclix Lancets lancets Use 1 lancet to check fasting glucose once daily and as needed for diabetes   amLODipine  (NORVASC ) 10 MG tablet Take 1 tablet (10 mg total) by mouth daily.   budesonide -glycopyrrolate-formoterol  (BREZTRI  AEROSPHERE) 160-9-4.8 MCG/ACT AERO inhaler Inhale 2 puffs into the lungs 2 (two) times daily.   buPROPion (WELLBUTRIN XL) 150 MG 24 hr tablet TK 1 T PO QAM   carbamazepine (CARBATROL) 300 MG 12 hr capsule TK 2 [place] PO QHS   carvedilol  (COREG ) 6.25 MG tablet TAKE 1 TABLET(6.25 MG) BY MOUTH TWICE DAILY WITH A MEAL   clindamycin  (CLEOCIN  T) 1 % external solution Apply 1 Application topically 2 (two) times daily.   diclofenac  Sodium (VOLTAREN ) 1 % GEL Apply 4 g topically 4 (four) times daily.   dicyclomine (BENTYL) 20 MG tablet Take by mouth.   glucose blood (ACCU-CHEK GUIDE) test strip Use 1 test strip to check fasting glucose once daily and as need for diabetes   Ipratropium-Albuterol  (COMBIVENT  RESPIMAT) 20-100 MCG/ACT AERS respimat Inhale 1 puff into the lungs every 6 (six) hours as needed for wheezing or shortness of breath.   LATUDA 60 MG TABS TK 1 T PO WITH DINNER   levothyroxine  (SYNTHROID ) 50 MCG tablet Take 1 tab po daily on an empty stomach   lidocaine  (LIDODERM ) 5 % Place 1 patch onto the skin  daily. Remove & Discard patch within 12 hours or as directed by MD   NUEDEXTA 20-10 MG CAPS TK ONE C PO BID FOR PSEUDOBULBAR EFFECT   pantoprazole  (PROTONIX ) 40 MG tablet Take 1 tablet (40 mg total) by mouth daily.   predniSONE  (DELTASONE ) 10 MG tablet Take one tab 3 x day for 3 days, then take one tab 2 x a day for 3 days and then take one tab a day for 3 days   roflumilast  (DALIRESP ) 500 MCG TABS tablet Take 1 tablet (500 mcg total) by mouth daily.   rosuvastatin  (CRESTOR ) 5 MG tablet Take 1 tablet (5 mg total) by mouth daily.   Vitamin D , Ergocalciferol , (DRISDOL ) 1.25 MG (50000 UNIT) CAPS capsule Take 1 capsule (50,000 Units total) by mouth every 7 (seven) days.   No facility-administered encounter medications on file as of 07/21/2024.    Surgical History: Past Surgical History:  Procedure Laterality Date   COSMETIC SURGERY     LAPAROSCOPIC OOPHORECTOMY     few  times   TONSILLECTOMY Bilateral     Medical History: Past Medical History:  Diagnosis Date   Bipolar 1 disorder (HCC)    Depression    Hypertension    Panic attack     Family History: Family History  Adopted: Yes    Social History   Socioeconomic History   Marital status: Single    Spouse name: Not on file  Number of children: Not on file   Years of education: Not on file   Highest education level: Not on file  Occupational History   Not on file  Tobacco Use   Smoking status: Every Day    Current packs/day: 1.00    Types: Cigarettes   Smokeless tobacco: Never   Tobacco comments:    1 PPD  Substance and Sexual Activity   Alcohol use: Yes    Comment: ocassionally    Drug use: No   Sexual activity: Not on file  Other Topics Concern   Not on file  Social History Narrative   Not on file   Social Drivers of Health   Financial Resource Strain: Not on file  Food Insecurity: Not on file  Transportation Needs: Not on file  Physical Activity: Not on file  Stress: Not on file  Social Connections: Not  on file  Intimate Partner Violence: Not on file      Review of Systems  Constitutional:  Negative for chills, fatigue, fever and unexpected weight change.  HENT:  Negative for congestion, rhinorrhea, sneezing and sore throat.   Eyes:  Negative for redness.  Respiratory:  Positive for cough and shortness of breath (improving). Negative for chest tightness and wheezing.   Cardiovascular:  Negative for chest pain and palpitations.  Gastrointestinal:  Negative for abdominal pain, constipation, diarrhea, nausea and vomiting.  Genitourinary:  Negative for dysuria and frequency.  Musculoskeletal:  Positive for arthralgias, back pain, neck pain and neck stiffness. Negative for joint swelling.  Skin: Negative.  Negative for rash.  Neurological: Negative.  Negative for dizziness, tremors, light-headedness, numbness and headaches.  Hematological:  Negative for adenopathy. Does not bruise/bleed easily.  Psychiatric/Behavioral:  Positive for behavioral problems (Depression). Negative for self-injury, sleep disturbance and suicidal ideas. The patient is nervous/anxious.     Vital Signs: BP 130/70 Comment: 145/77  Pulse 95   Temp 98 F (36.7 C)   Resp 16   Ht 5' 3 (1.6 m)   Wt 129 lb 9.6 oz (58.8 kg)   SpO2 97%   BMI 22.96 kg/m    Physical Exam Vitals reviewed.  Constitutional:      General: She is not in acute distress.    Appearance: Normal appearance. She is normal weight. She is not ill-appearing.  HENT:     Head: Normocephalic and atraumatic.  Eyes:     Pupils: Pupils are equal, round, and reactive to light.  Cardiovascular:     Rate and Rhythm: Normal rate and regular rhythm.  Pulmonary:     Effort: Pulmonary effort is normal. No respiratory distress.     Breath sounds: Normal breath sounds. No wheezing.  Musculoskeletal:     Cervical back: Tenderness present.  Lymphadenopathy:     Cervical: No cervical adenopathy.  Neurological:     Mental Status: She is alert and  oriented to person, place, and time.  Psychiatric:        Mood and Affect: Mood normal.        Behavior: Behavior normal.        Assessment/Plan: 1. Prediabetes (Primary) A1c slightly elevated, patient has a glucose meter, discussed diet changes.   2. Vertigo Referred to ENT - Ambulatory referral to ENT  3. Dizziness Referred to ENT - Ambulatory referral to ENT  4. Chronic thoracic spine pain Prednisone  taper ordered  - predniSONE  (DELTASONE ) 10 MG tablet; Take one tab 3 x day for 3 days, then take one tab 2 x a  day for 3 days and then take one tab a day for 3 days  Dispense: 18 tablet; Refill: 0  5. Cervical radiculopathy Prednisone  taper ordered  - predniSONE  (DELTASONE ) 10 MG tablet; Take one tab 3 x day for 3 days, then take one tab 2 x a day for 3 days and then take one tab a day for 3 days  Dispense: 18 tablet; Refill: 0  6. Impaired fasting glucose A1c is slightly elevated.  - POCT HgB A1C   General Counseling: Rieley verbalizes understanding of the findings of todays visit and agrees with plan of treatment. I have discussed any further diagnostic evaluation that may be needed or ordered today. We also reviewed her medications today. she has been encouraged to call the office with any questions or concerns that should arise related to todays visit.    Orders Placed This Encounter  Procedures   Ambulatory referral to ENT   POCT HgB A1C    Meds ordered this encounter  Medications   predniSONE  (DELTASONE ) 10 MG tablet    Sig: Take one tab 3 x day for 3 days, then take one tab 2 x a day for 3 days and then take one tab a day for 3 days    Dispense:  18 tablet    Refill:  0    Fill new script today    Return in about 3 months (around 10/20/2024) for F/U, Orris Perin PCP.   Total time spent:30 Minutes Time spent includes review of chart, medications, test results, and follow up plan with the patient.   [place] Controlled Substance Database was reviewed by me.  This  patient was seen by Mardy Maxin, FNP-C in collaboration with Dr. Sigrid Bathe as a part of collaborative care agreement.   Naomia Lenderman R. Maxin, MSN, FNP-C Internal medicine

## 2024-07-22 ENCOUNTER — Other Ambulatory Visit: Payer: Self-pay | Admitting: Nurse Practitioner

## 2024-07-22 DIAGNOSIS — J432 Centrilobular emphysema: Secondary | ICD-10-CM

## 2024-07-28 ENCOUNTER — Telehealth: Payer: Self-pay | Admitting: Nurse Practitioner

## 2024-07-28 NOTE — Telephone Encounter (Signed)
 Awaiting 07/21/24 office notes for Otolaryngology referral-Toni

## 2024-07-29 ENCOUNTER — Other Ambulatory Visit: Payer: Self-pay | Admitting: Nurse Practitioner

## 2024-07-29 DIAGNOSIS — E782 Mixed hyperlipidemia: Secondary | ICD-10-CM

## 2024-07-29 DIAGNOSIS — Z0001 Encounter for general adult medical examination with abnormal findings: Secondary | ICD-10-CM

## 2024-08-16 ENCOUNTER — Encounter: Payer: Self-pay | Admitting: Nurse Practitioner

## 2024-08-16 ENCOUNTER — Telehealth: Payer: Self-pay | Admitting: Nurse Practitioner

## 2024-08-16 NOTE — Telephone Encounter (Signed)
 Otolaryngology referral sent via [facility] to [facility]. Notified patient. Gave telephone # [phone]

## 2024-08-19 ENCOUNTER — Telehealth: Payer: Self-pay | Admitting: Nurse Practitioner

## 2024-08-19 NOTE — Telephone Encounter (Signed)
 Otolaryngology appointment 09/10/2024 with [facility]-T[facility]

## 2024-09-15 ENCOUNTER — Other Ambulatory Visit: Payer: Self-pay | Admitting: Otolaryngology

## 2024-09-15 DIAGNOSIS — R42 Dizziness and giddiness: Secondary | ICD-10-CM

## 2024-09-22 ENCOUNTER — Encounter: Payer: Self-pay | Admitting: Otolaryngology

## 2024-09-28 ENCOUNTER — Ambulatory Visit
Admission: RE | Admit: 2024-09-28 | Discharge: 2024-09-28 | Disposition: A | Discharge status: Home / Self Care | Payer: MEDICAID | Source: Ambulatory Visit | Attending: Otolaryngology | Admitting: Otolaryngology

## 2024-09-28 DIAGNOSIS — R42 Dizziness and giddiness: Secondary | ICD-10-CM

## 2024-10-26 ENCOUNTER — Other Ambulatory Visit: Payer: Self-pay | Admitting: Nurse Practitioner

## 2024-10-26 DIAGNOSIS — I10 Essential (primary) hypertension: Secondary | ICD-10-CM

## 2024-11-16 ENCOUNTER — Other Ambulatory Visit: Payer: Self-pay

## 2024-11-16 DIAGNOSIS — J432 Centrilobular emphysema: Secondary | ICD-10-CM

## 2024-11-16 MED ORDER — ROFLUMILAST 500 MCG PO TABS: 500.0000 ug | ORAL_TABLET | Freq: Every day | ORAL | 1 refills | Status: AC

## 2025-05-24 ENCOUNTER — Encounter: Payer: MEDICAID | Admitting: Nurse Practitioner
